# Patient Record
Sex: Male | Born: 1990 | Race: Black or African American | Hispanic: No | Marital: Single | Smoking: Never smoker
Health system: Southern US, Community
[De-identification: ages and names within clinical notes are randomized; demographics above are authoritative.]

## PROBLEM LIST (undated history)

## (undated) DIAGNOSIS — R509 Fever, unspecified: Secondary | ICD-10-CM

## (undated) DIAGNOSIS — G8929 Other chronic pain: Secondary | ICD-10-CM

## (undated) DIAGNOSIS — M545 Low back pain, unspecified: Secondary | ICD-10-CM

## (undated) HISTORY — PX: NO PAST SURGERIES: SHX2092

---

## 2017-01-30 ENCOUNTER — Inpatient Hospital Stay (HOSPITAL_COMMUNITY)
Admission: EM | Admit: 2017-01-30 | Discharge: 2017-02-04 | DRG: 864 | Disposition: A | Discharge status: Home / Self Care | Payer: Self-pay | Attending: Internal Medicine | Admitting: Internal Medicine

## 2017-01-30 ENCOUNTER — Emergency Department (HOSPITAL_COMMUNITY): Payer: Self-pay

## 2017-01-30 ENCOUNTER — Inpatient Hospital Stay (HOSPITAL_COMMUNITY): Payer: Self-pay

## 2017-01-30 ENCOUNTER — Encounter (HOSPITAL_COMMUNITY): Payer: Self-pay | Admitting: *Deleted

## 2017-01-30 DIAGNOSIS — D721 Eosinophilia: Secondary | ICD-10-CM | POA: Diagnosis present

## 2017-01-30 DIAGNOSIS — K760 Fatty (change of) liver, not elsewhere classified: Secondary | ICD-10-CM | POA: Diagnosis present

## 2017-01-30 DIAGNOSIS — E860 Dehydration: Secondary | ICD-10-CM | POA: Diagnosis present

## 2017-01-30 DIAGNOSIS — R911 Solitary pulmonary nodule: Secondary | ICD-10-CM | POA: Diagnosis present

## 2017-01-30 DIAGNOSIS — R072 Precordial pain: Secondary | ICD-10-CM | POA: Diagnosis present

## 2017-01-30 DIAGNOSIS — R509 Fever, unspecified: Principal | ICD-10-CM | POA: Diagnosis present

## 2017-01-30 DIAGNOSIS — D649 Anemia, unspecified: Secondary | ICD-10-CM | POA: Diagnosis present

## 2017-01-30 DIAGNOSIS — R7402 Elevation of levels of lactic acid dehydrogenase (LDH): Secondary | ICD-10-CM

## 2017-01-30 DIAGNOSIS — E871 Hypo-osmolality and hyponatremia: Secondary | ICD-10-CM | POA: Diagnosis present

## 2017-01-30 DIAGNOSIS — R21 Rash and other nonspecific skin eruption: Secondary | ICD-10-CM | POA: Diagnosis present

## 2017-01-30 DIAGNOSIS — M25561 Pain in right knee: Secondary | ICD-10-CM | POA: Diagnosis present

## 2017-01-30 DIAGNOSIS — R740 Nonspecific elevation of levels of transaminase and lactic acid dehydrogenase [LDH]: Secondary | ICD-10-CM | POA: Diagnosis present

## 2017-01-30 DIAGNOSIS — M20099 Other deformity of finger(s), unspecified finger(s): Secondary | ICD-10-CM | POA: Diagnosis present

## 2017-01-30 DIAGNOSIS — R0789 Other chest pain: Secondary | ICD-10-CM

## 2017-01-30 DIAGNOSIS — I361 Nonrheumatic tricuspid (valve) insufficiency: Secondary | ICD-10-CM

## 2017-01-30 DIAGNOSIS — M25562 Pain in left knee: Secondary | ICD-10-CM | POA: Diagnosis present

## 2017-01-30 DIAGNOSIS — R590 Localized enlarged lymph nodes: Secondary | ICD-10-CM | POA: Diagnosis present

## 2017-01-30 DIAGNOSIS — N179 Acute kidney failure, unspecified: Secondary | ICD-10-CM | POA: Diagnosis present

## 2017-01-30 HISTORY — DX: Fever, unspecified: R50.9

## 2017-01-30 HISTORY — DX: Low back pain, unspecified: M54.50

## 2017-01-30 HISTORY — DX: Low back pain: M54.5

## 2017-01-30 HISTORY — DX: Other chronic pain: G89.29

## 2017-01-30 LAB — CBC WITH DIFFERENTIAL/PLATELET
BASOS PCT: 1 %
Basophils Absolute: 0.1 10*3/uL (ref 0.0–0.1)
EOS ABS: 0 10*3/uL (ref 0.0–0.7)
EOS PCT: 0 %
HEMATOCRIT: 34.7 % — AB (ref 39.0–52.0)
Hemoglobin: 11.2 g/dL — ABNORMAL LOW (ref 13.0–17.0)
LYMPHS PCT: 11 %
Lymphs Abs: 1.6 10*3/uL (ref 0.7–4.0)
MCH: 27.6 pg (ref 26.0–34.0)
MCHC: 32.3 g/dL (ref 30.0–36.0)
MCV: 85.5 fL (ref 78.0–100.0)
Monocytes Absolute: 0.6 10*3/uL (ref 0.1–1.0)
Monocytes Relative: 4 %
NEUTROS PCT: 84 %
Neutro Abs: 12.3 10*3/uL — ABNORMAL HIGH (ref 1.7–7.7)
PLATELETS: 638 10*3/uL — AB (ref 150–400)
RBC: 4.06 MIL/uL — AB (ref 4.22–5.81)
RDW: 12.8 % (ref 11.5–15.5)
WBC: 14.6 10*3/uL — AB (ref 4.0–10.5)

## 2017-01-30 LAB — ECHOCARDIOGRAM COMPLETE
AVLVOTPG: 5 mmHg
CHL CUP DOP CALC LVOT VTI: 16.3 cm
CHL CUP MV DEC (S): 195
E/e' ratio: 11.29
EWDT: 195 ms
FS: 50 % — AB (ref 28–44)
Height: 63 in
IV/PV OW: 1.05
LA diam index: 1.41 cm/m2
LASIZE: 27 mm
LAVOL: 37.8 mL
LAVOLA4C: 37.7 mL
LAVOLIN: 19.7 mL/m2
LDCA: 2.54 cm2
LEFT ATRIUM END SYS DIAM: 27 mm
LV E/e' medial: 11.29
LV E/e'average: 11.29
LV PW d: 9.49 mm — AB (ref 0.6–1.1)
LV TDI E'MEDIAL: 7.4
LVELAT: 6.53 cm/s
LVOT peak vel: 115 cm/s
LVOTD: 18 mm
LVOTSV: 41 mL
MV Peak grad: 2 mmHg
MV pk E vel: 73.7 m/s
MVPKAVEL: 56.8 m/s
TAPSE: 24.8 mm
TDI e' lateral: 6.53
Weight: 2821.89 oz

## 2017-01-30 LAB — RAPID HIV SCREEN (HIV 1/2 AB+AG)
HIV 1/2 ANTIBODIES: NONREACTIVE
HIV-1 P24 ANTIGEN - HIV24: NONREACTIVE

## 2017-01-30 LAB — TROPONIN I
Troponin I: <0.03 ng/mL (ref <0.03)
Troponin I: <0.03 ng/mL (ref <0.03)

## 2017-01-30 LAB — URINALYSIS, ROUTINE W REFLEX MICROSCOPIC
Bilirubin Urine: NEGATIVE
GLUCOSE, UA: NEGATIVE mg/dL
Ketones, ur: 20 mg/dL — AB
LEUKOCYTES UA: NEGATIVE
NITRITE: NEGATIVE
PH: 5 (ref 5.0–8.0)
Protein, ur: 30 mg/dL — AB
Renal Epithelial: 4
SPECIFIC GRAVITY, URINE: 1.026 (ref 1.005–1.030)
Squamous Epithelial / LPF: NONE SEEN

## 2017-01-30 LAB — COMPREHENSIVE METABOLIC PANEL
ALT: 32 U/L (ref 17–63)
ANION GAP: 14 (ref 5–15)
AST: 95 U/L — ABNORMAL HIGH (ref 15–41)
Albumin: 2.9 g/dL — ABNORMAL LOW (ref 3.5–5.0)
Alkaline Phosphatase: 89 U/L (ref 38–126)
BUN: 20 mg/dL (ref 6–20)
CHLORIDE: 95 mmol/L — AB (ref 101–111)
CO2: 19 mmol/L — AB (ref 22–32)
Calcium: 9 mg/dL (ref 8.9–10.3)
Creatinine, Ser: 1.33 mg/dL — ABNORMAL HIGH (ref 0.61–1.24)
Glucose, Bld: 99 mg/dL (ref 65–99)
Potassium: 3.3 mmol/L — ABNORMAL LOW (ref 3.5–5.1)
SODIUM: 128 mmol/L — AB (ref 135–145)
Total Bilirubin: 1.4 mg/dL — ABNORMAL HIGH (ref 0.3–1.2)
Total Protein: 8 g/dL (ref 6.5–8.1)

## 2017-01-30 LAB — CK: CK TOTAL: 254 U/L (ref 49–397)

## 2017-01-30 LAB — OSMOLALITY, URINE: OSMOLALITY UR: 824 mosm/kg (ref 300–900)

## 2017-01-30 LAB — I-STAT CG4 LACTIC ACID, ED: LACTIC ACID, VENOUS: 1.87 mmol/L (ref 0.5–1.9)

## 2017-01-30 LAB — SEDIMENTATION RATE: Sed Rate: 123 mm/hr — ABNORMAL HIGH (ref 0–16)

## 2017-01-30 LAB — SAVE SMEAR

## 2017-01-30 LAB — C-REACTIVE PROTEIN: CRP: 19.4 mg/dL — ABNORMAL HIGH (ref <1.0)

## 2017-01-30 LAB — CREATININE, URINE, RANDOM: Creatinine, Urine: 265.84 mg/dL

## 2017-01-30 LAB — SODIUM, URINE, RANDOM: SODIUM UR: 36 mmol/L

## 2017-01-30 MED ORDER — LACTATED RINGERS IV BOLUS (SEPSIS)
1000.0000 mL | Freq: Once | INTRAVENOUS | Status: AC
  Administered 2017-01-30: 1000 mL via INTRAVENOUS

## 2017-01-30 MED ORDER — ACETAMINOPHEN 650 MG RE SUPP: 650.0000 mg | Freq: Four times a day (QID) | RECTAL | Status: DC | PRN

## 2017-01-30 MED ORDER — ACETAMINOPHEN 325 MG PO TABS
650.0000 mg | ORAL_TABLET | Freq: Four times a day (QID) | ORAL | Status: DC | PRN
  Administered 2017-01-30 – 2017-02-01 (×6): 650 mg via ORAL
  Filled 2017-01-30 (×6): qty 2

## 2017-01-30 MED ORDER — ACETAMINOPHEN 500 MG PO TABS
1000.0000 mg | ORAL_TABLET | Freq: Once | ORAL | Status: AC
  Administered 2017-01-30: 1000 mg via ORAL
  Filled 2017-01-30: qty 2

## 2017-01-30 MED ORDER — ONDANSETRON HCL 4 MG/2ML IJ SOLN: 4.0000 mg | Freq: Four times a day (QID) | INTRAMUSCULAR | Status: DC | PRN

## 2017-01-30 MED ORDER — ONDANSETRON HCL 4 MG PO TABS: 4.0000 mg | ORAL_TABLET | Freq: Four times a day (QID) | ORAL | Status: DC | PRN

## 2017-01-30 MED ORDER — ENSURE ENLIVE PO LIQD
237.0000 mL | Freq: Two times a day (BID) | ORAL | Status: DC
  Administered 2017-01-31 – 2017-02-01 (×3): 237 mL via ORAL

## 2017-01-30 MED ORDER — SODIUM CHLORIDE 0.9 % IV SOLN
INTRAVENOUS | Status: DC
  Administered 2017-01-30 – 2017-02-03 (×11): via INTRAVENOUS

## 2017-01-30 MED ORDER — HYDROCORTISONE 1 % EX CREA
TOPICAL_CREAM | Freq: Two times a day (BID) | CUTANEOUS | Status: DC
  Administered 2017-01-30 – 2017-02-04 (×9): via TOPICAL
  Filled 2017-01-30 (×3): qty 28

## 2017-01-30 MED ORDER — ENOXAPARIN SODIUM 40 MG/0.4ML ~~LOC~~ SOLN
40.0000 mg | SUBCUTANEOUS | Status: AC
  Administered 2017-01-30 – 2017-02-02 (×4): 40 mg via SUBCUTANEOUS
  Filled 2017-01-30 (×4): qty 0.4

## 2017-01-30 NOTE — H&P (Signed)
History and Physical    Carlos Holder KNL:[id number] DOB: 11/23/1990 DOA: 01/30/2017  PCP: Patient, No Pcp Per Patient coming from: home  Chief Complaint: generalized body aches, hand swelling, fevers.   HPI: Carlos Holder is a 26 y.o. male with no significant past medical history. Patient is an immigrant to the [place] from the [place] 2 years ago. Patient states he is not up-to-date on any of his immunizations. Patient was in a home with 3 other individuals from [place]. All others in the home are healthy but is unsure of their immunization status. No individuals in the home have traveled outside the country in the last several months.  Patient was in his normal state of health until approximately 3 weeks prior to admission when he developed intermittent knee pain. Seemed to have a waxing and waning nature but was worse with ambulation. This would occur approximately 3-4 days out of the week. Patient unable to identify any trigger or alleviating factor associated with the pain other than rest. (Level V caveat applies as patient encounter was dictated by [place] interpreter with some difficulty. For example through the interpreter pt stated he had foot pain but was clearly pointing to his knee.). Patient states he has also felt warm over this period of time and has also felt itchy. Patient does endorse chest pain which seems to be described as a deep feeling of pain in his chest. This pain is nonexertional and is not associated with shortness of breath, diaphoresis, nausea or radiation to the neck arm or back. This pain is constant and mild. Rash on upper chest started approximately 1 week ago gradually across upper chest, arms and upper back. Patient denies ever being sexually active. Denies nausea, vomiting, abdominal pain, dysuria, frequency, penile discharge, groin adenopathy, neck stiffness, headache, LOC, focal neurological deficit. Patient does endorse approximately 2 week history  of bilateral middle finger swelling and pain. Constant.  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 Given a 1 L lactated Ringer's bolus and 1000 mg of Tylenol for fever. ID consult.  Review of Systems: As per HPI otherwise all other systems reviewed and are negative  Ambulatory Status: no restrictions  History reviewed. No pertinent past medical history.  Past Surgical History:  Procedure Laterality Date  . NO PAST SURGERIES      Social History   Social History  . Marital status: Single    Spouse name: N/A  . Number of children: N/A  . Years of education: N/A   Occupational History  . Not on file.   Social History Main Topics  . Smoking status: Never Smoker  . Smokeless tobacco: Never Used  . Alcohol use Yes     Comment: occ  . Drug use: No  . Sexual activity: No   Other Topics Concern  . Not on file   Social History Narrative  . No narrative on file    No Known Allergies  Family History  Problem Relation Age of Onset  . Stroke Neg Hx   . Heart disease Neg Hx   . Diabetes Neg Hx       Prior to Admission medications   Not on File    Physical Exam: Vitals:   01/30/17 1400 01/30/17 1422 01/30/17 1500 01/30/17 1600  BP: 118/80  121/80 114/80  Pulse: 90  92 99  Resp: (!) 21  (!) 21 (!) 22  Temp:  99.7 F (37.6 C)  TempSrc:  Oral    SpO2: 96%  99% 96%  Weight:      Height:         General:  Appears calm and comfortable Eyes:  PERRL, EOMI, normal lids, iris ENT:  grossly normal hearing, lips & tongue, mmm Neck:  no LAD, masses or thyromegaly Cardiovascular:  RRR, no m/r/g. No LE edema.  Respiratory:  CTA bilaterally, no w/r/r. Normal respiratory effort. Abdomen:  soft, ntnd, NABS Skin: Areas of excoriated skin along the upper chest bilateral upper extremities proximally and upper back. No appreciable papules, vesicles or other rash. Areas of flaky dry skin appreciated. Musculoskeletal:   grossly normal tone BUE/BLE, good ROM, no bony abnormality, knees tender to palpation bilaterally though no edema appreciated, bilateral middle fingers swollen Psychiatric:  grossly normal mood and affect, speech fluent and appropriate, AOx3 Neurologic:  CN 2-12 grossly intact, moves all extremities in coordinated fashion, sensation intact  Labs on Admission: I have personally reviewed following labs and imaging studies  CBC:  Recent Labs Lab 01/30/17 0925  WBC 14.6*  NEUTROABS 12.3*  HGB 11.2*  HCT 34.7*  MCV 85.5  PLT 814*   Basic Metabolic Panel:  Recent Labs Lab 01/30/17 0925  NA 128*  K 3.3*  CL 95*  CO2 19*  GLUCOSE 99  BUN 20  CREATININE 1.33*  CALCIUM 9.0   GFR: Estimated Creatinine Clearance: 78 mL/min (A) (by C-G formula based on SCr of 1.33 mg/dL (H)). Liver Function Tests:  Recent Labs Lab 01/30/17 0925  AST 95*  ALT 32  ALKPHOS 89  BILITOT 1.4*  PROT 8.0  ALBUMIN 2.9*   No results for input(s): LIPASE, AMYLASE in the last 168 hours. No results for input(s): AMMONIA in the last 168 hours. Coagulation Profile: No results for input(s): INR, PROTIME in the last 168 hours. Cardiac Enzymes:  Recent Labs Lab 01/30/17 0925 01/30/17 1135  CKTOTAL 254  --   TROPONINI  --  <0.03   BNP (last 3 results) No results for input(s): PROBNP in the last 8760 hours. HbA1C: No results for input(s): HGBA1C in the last 72 hours. CBG: No results for input(s): GLUCAP in the last 168 hours. Lipid Profile: No results for input(s): CHOL, HDL, LDLCALC, TRIG, CHOLHDL, LDLDIRECT in the last 72 hours. Thyroid Function Tests: No results for input(s): TSH, T4TOTAL, FREET4, T3FREE, THYROIDAB in the last 72 hours. Anemia Panel: No results for input(s): VITAMINB12, FOLATE, FERRITIN, TIBC, IRON, RETICCTPCT in the last 72 hours. Urine analysis:    Component Value Date/Time   COLORURINE YELLOW 01/30/2017 1248   APPEARANCEUR HAZY (A) 01/30/2017 1248   LABSPEC 1.026  01/30/2017 1248   PHURINE 5.0 01/30/2017 1248   GLUCOSEU NEGATIVE 01/30/2017 1248   HGBUR MODERATE (A) 01/30/2017 1248   BILIRUBINUR NEGATIVE 01/30/2017 1248   KETONESUR 20 (A) 01/30/2017 1248   PROTEINUR 30 (A) 01/30/2017 1248   NITRITE NEGATIVE 01/30/2017 1248   LEUKOCYTESUR NEGATIVE 01/30/2017 1248    Creatinine Clearance: Estimated Creatinine Clearance: 78 mL/min (A) (by C-G formula based on SCr of 1.33 mg/dL (H)).  Sepsis Labs: @LABRCNTIP (procalcitonin:4,lacticidven:4) )No results found for this or any previous visit (from the past 240 hour(s)).   Radiological Exams on Admission: Dg Chest 2 View  Result Date: 01/30/2017 CLINICAL DATA:  Body aches, rash and fevers, 3 weeks duration. EXAM: CHEST  2 VIEW COMPARISON:  None. FINDINGS: Heart size is normal. Mediastinal shadows are normal. Mild left hilar prominence likely due to pulmonary vessels. The lungs are clear. No  effusions. No bone abnormality. IMPRESSION: Normal chest Electronically Signed   By: Nelson Chimes M.D.   On: 01/30/2017 10:01    EKG: Independently reviewed. Sinus. No ACS. Non-specific T-wave changes  Assessment/Plan Active Problems:   FUO (fever of unknown origin)   Atypical chest pain   Rash   AKI (acute kidney injury) ([place])   Hyponatremia    FUO: constellation of sx w/o clearly identifiable etiology. Rash on arms and chest/back, B knee pain w/o swelling, B middle finger swelling and pain, and fever for 3 weeks, WBC 14.6. Unknown if pt has had any vaccinations as he denies being up to date. Imigrant from [place] 2 yrs ago. Denies ever being sexually active. Suspect viral etiology but will need assistance from ID to further diagnose/work up. CXR w/o acute process. - ID consult - BCX, UCX  - UA - ESR, CRP, HIV, RPR, GC/Chl, CK, CMV, EBV, Hepatitis panel - Peripheral smear - Viral panel - TYlenol/motrin prn fever.   Chest pain: I suspect that difficulty with interpretation makes patient's description of chest  pain somewhat difficult to fully determine etiology. Initially there is concern for possible endocarditis given EKG changes, description of deep chest pain that was constant. However I am also considering that this is likely simply due to the rash on his chest wall. - Telemetry  - Echo STAT - cycle trop - treatment of rash  Rash: doubt scabies or bed bugs. Likely due to cause of FUO.  - Will provide hydrocortisone 1% cream for itch relief - consider treatment as fungal or bacterial based rash if not improving.   AKI: Cr 1.33. Likely from dehydration from very poor oral intake for several days. - IVF - BMP in am  Hyponatremia: 128. Likely from poor oral intake for several days w/ limited free water intake.  - Osmolality studies  - IVF - BMP in am   DVT prophylaxis: Lovenox   Code Status: full  Family Communication: none  Disposition Plan: pending improvement and workup fur FUO  Consults called: ID  Admission status: inpt    Carlos Holder J MD [facility]  If 7PM-7AM, please contact night-coverage [email or web address] Password [email or web address]  01/30/2017, 5:09 PM

## 2017-01-30 NOTE — Consult Note (Signed)
[facility]    Date of Admission:  01/30/2017    Total days of antibiotics 0              Reason for Consult: Fever of unknown etiology    Referring Provider: Shelly Flatten MD Primary Care Provider: No PCP listed  Assessment: Fever of unknown etiology. He is having very nonspecific symptoms with elevated inflammatory markers and some transaminitis, most lab work is underway including cultures and testing for CMV, EBV, hepatitis panel. He appears dehydrated and might get benefit with IV fluid resuscitation.  Plan: 1. We will wait for further testing results and manage fever symptomatically.  Active Problems:   FUO (fever of unknown origin)     HPI: Carlos Holder is a 26 y.o. male with no significant PMHx presented with c/o intermittent fever for 3 weeks,he never checked his temperature at home,his father was giving him some unknown medicine for fever.  His ROS was negative except mild non specific substernal chest pain, and upper back pain and some unintentional weight loss but he never weigh himself.  IN ED he was found to have temp. Of 103.4 and ID was consulted for fever of unknown etiology.  Review of Systems: ROS  A complete ROS was negative except as per HPI.  General: Positive for fever, chills, fatigue, unexpected weight loss, change in appetite and diaphoresis.  Respiratory: Denies SOB, cough, DOE, chest tightness, and wheezing.    Cardiovascular: Substernal chest pain,  And no palpitations.  Gastrointestinal: Denies nausea, vomiting, abdominal pain, diarrhea, constipation, blood in stool and abdominal distention.  Genitourinary: Denies dysuria, urgency, frequency, hematuria, suprapubic pain and flank pain. Endocrine: Denies hot or cold intolerance, polyuria, and polydipsia. Musculoskeletal:upper back pain, Denies myalgias,  joint swelling, arthralgias and gait problem.  Skin: Denies pallor, rash and wounds.  Neurological: Denies  dizziness, headaches, weakness, lightheadedness, numbness, seizures, and syncope. Psychiatric/Behavioral: Denies mood changes, confusion, nervousness, sleep disturbance and agitation.  History reviewed. No pertinent past medical history.  Social History  Substance Use Topics  . Smoking status: Never Smoker  . Smokeless tobacco: Never Used  . Alcohol use Yes     Comment: occ    No family history on file. No Known Allergies  OBJECTIVE: Blood pressure 118/80, pulse 90, temperature 99.7 F (37.6 C), temperature source Oral, resp. rate (!) 21, height 5\' 3"  (1.6 m), weight 176 lb 5.9 oz (80 kg), SpO2 96 %.  Physical Exam  Vitals:   01/30/17 1200 01/30/17 1300 01/30/17 1400 01/30/17 1422  BP: 114/74 120/84 118/80   Pulse: 93 88 90   Resp: 16 19 (!) 21   Temp:    99.7 F (37.6 C)  TempSrc:    Oral  SpO2: 98% 97% 96%   Weight:      Height:       General: Vital signs reviewed.  Patient is well-developed and well-nourished, in no acute distress and cooperative with exam.  Head: Normocephalic and atraumatic. Eyes: EOMI, conjunctivae normal, no scleral icterus.  Neck: Supple, trachea midline, normal ROM, no JVD, masses, thyromegaly,lymphadenopathy  present.  Cardiovascular: RRR, S1 normal, S2 normal, no murmurs, gallops, or rubs. Pulmonary/Chest: Clear to auscultation bilaterally, no wheezes, rales, or rhonchi. Abdominal: Soft, non-tender, non-distended, BS +, no masses, organomegaly, or guarding present.  Musculoskeletal: No joint deformities, erythema, or stiffness, ROM full and nontender. Extremities: No lower extremity edema bilaterally,  pulses symmetric and intact bilaterally. No cyanosis or clubbing.  Neurological: A&O x3, Strength is normal and symmetric bilaterally, cranial nerve II-XII are grossly intact, no focal motor deficit, sensory intact to light touch bilaterally.  Skin: Warm, dry and intact.Small excoriation mark on upper thoracic area Psychiatric: Normal mood and  affect. speech and behavior is normal. Cognition and memory are normal.  Lab Results Lab Results  Component Value Date   WBC 14.6 (H) 01/30/2017   HGB 11.2 (L) 01/30/2017   HCT 34.7 (L) 01/30/2017   MCV 85.5 01/30/2017   PLT 638 (H) 01/30/2017    Lab Results  Component Value Date   CREATININE 1.33 (H) 01/30/2017   BUN 20 01/30/2017   NA 128 (L) 01/30/2017   K 3.3 (L) 01/30/2017   CL 95 (L) 01/30/2017   CO2 19 (L) 01/30/2017    Lab Results  Component Value Date   ALT 32 01/30/2017   AST 95 (H) 01/30/2017   ALKPHOS 89 01/30/2017   BILITOT 1.4 (H) 01/30/2017     Microbiology: No results found for this or any previous visit (from the past 240 hour(s)).  Arnetha CourserSumayya Alen Matheson, MD [facility] [phone] pager   [phone] cell 01/30/2017, 3:03 PM

## 2017-01-30 NOTE — ED Notes (Signed)
Patient transported to X-ray 

## 2017-01-30 NOTE — ED Provider Notes (Signed)
[facility]EM[facility]ENCY DEPT Provider Note   CSN: [id number] Arrival date & time: 01/30/17  [id number]     History   Chief Complaint Chief Complaint  Patient presents with  . Generalized Body Aches    HPI Carlos Holder is a 26 y.o. male.  This is an otherwise healthy 26 year old immigrant from [place] who emigrated 2 years ago but now presents immersed department with approximately 3-4 weeks of multiple symptoms. It sounds like his symptoms started with bilateral foot pain that persisted for a few days without any symptoms of swelling or rash and then he developed a fever that was subjective associated with a rash on his torso. These are gone on for a few weeks. The rash is pruritic and covers his anterior chest and his upper back and is just shedding skin basically. Was never petechial or purpuric. During last few weeks she has also had swelling and pain of bilateral third fingers but no other joint issues. No blood bite, recent travels, sick contacts, weird foods or history of the same. Has not tried any of his symptoms but knows of nothing that makes them better or worse. His father states the patient has been more weak and ataxic is lost some weight over the last few weeks. No family history of autoimmune diseases or cancer that they know of.   The history is provided by the patient and a relative. A language interpreter was used.    History reviewed. No pertinent past medical history.  Patient Active Problem List   Diagnosis Date Noted  . FUO (fever of unknown origin) 01/30/2017    Past Surgical History:  Procedure Laterality Date  . NO PAST SURGERIES       patient has no significant past medical history presents to the emergency department today for fever, rash and multiple other complaints.   Home Medications    Prior to Admission medications   Medication Sig Start Date End Date Taking? Authorizing Provider  ibuprofen (ADVIL,MOTRIN) 200 MG tablet Take 200 mg by mouth  every 6 (six) hours as needed for moderate pain.   Yes [provider]    Family History Family History  Problem Relation Age of Onset  . Stroke Neg Hx   . Heart disease Neg Hx   . Diabetes Neg Hx     Social History Social History  Substance Use Topics  . Smoking status: Never Smoker  . Smokeless tobacco: Never Used  . Alcohol use Yes     Comment: occ     Allergies   Patient has no known allergies.   Review of Systems Review of Systems  All other systems reviewed and are negative.    Physical Exam Updated Vital Signs BP 114/80   Pulse 99   Temp 99.7 F (37.6 C) (Oral)   Resp (!) 22   Ht 5\' 3"  (1.6 m)   Wt 80 kg (176 lb 5.9 oz)   SpO2 96%   BMI 31.24 kg/m   Physical Exam  Constitutional: He is oriented to person, place, and time. He appears well-developed and well-nourished.  HENT:  Head: Normocephalic and atraumatic.  No oral lesions  Eyes: Conjunctivae and EOM are normal. Scleral icterus (mild) is present.  Neck: Normal range of motion.  Cardiovascular: Normal rate.  Exam reveals no gallop.   No murmur heard. Pulmonary/Chest: Effort normal. No respiratory distress. He has no wheezes. He has no rales.  Abdominal: Soft. He exhibits no distension.  Musculoskeletal: Normal range of motion.  Bilateral diffuse swelling of middle fingers with out erythema but does have pain with palpation and ROM  Neurological: He is alert and oriented to person, place, and time.  No altered mental status, able to give full seemingly accurate history.  Face is symmetric, EOM's intact, pupils equal and reactive, vision intact, tongue and uvula midline without deviation. Upper and Lower extremity motor 5/5, intact pain perception in distal extremities, 2+ reflexes in biceps, patella and achilles tendons.   Skin: Skin is warm and dry. Rash (dried peeling skin to upper chest and back none on arms/hands/legs or face) noted.  Nursing note and vitals reviewed.    ED  Treatments / Results  Labs (all labs ordered are listed, but only abnormal results are displayed) Labs Reviewed  COMPREHENSIVE METABOLIC PANEL - Abnormal; Notable for the following:       Result Value   Sodium 128 (*)    Potassium 3.3 (*)    Chloride 95 (*)    CO2 19 (*)    Creatinine, Ser 1.33 (*)    Albumin 2.9 (*)    AST 95 (*)    Total Bilirubin 1.4 (*)    All other components within normal limits  CBC WITH DIFFERENTIAL/PLATELET - Abnormal; Notable for the following:    WBC 14.6 (*)    RBC 4.06 (*)    Hemoglobin 11.2 (*)    HCT 34.7 (*)    Platelets 638 (*)    Neutro Abs 12.3 (*)    All other components within normal limits  URINALYSIS, ROUTINE W REFLEX MICROSCOPIC - Abnormal; Notable for the following:    APPearance HAZY (*)    Hgb urine dipstick MODERATE (*)    Ketones, ur 20 (*)    Protein, ur 30 (*)    Bacteria, UA FEW (*)    All other components within normal limits  SEDIMENTATION RATE - Abnormal; Notable for the following:    Sed Rate 123 (*)    All other components within normal limits  C-REACTIVE PROTEIN - Abnormal; Notable for the following:    CRP 19.4 (*)    All other components within normal limits  CULTURE, BLOOD (ROUTINE X 2)  CULTURE, BLOOD (ROUTINE X 2)  CK  RAPID HIV SCREEN (HIV 1/2 AB+AG)  TROPONIN I  OSMOLALITY, URINE  SODIUM, URINE, RANDOM  CREATININE, URINE, RANDOM  RHEUMATOID FACTOR  RPR  HIV ANTIBODY (ROUTINE TESTING)  HEPATITIS PANEL, ACUTE  EPSTEIN-BARR VIRUS VCA, IGM  CMV IGM  CMV ANTIBODY, IGG (EIA)  EPSTEIN-BARR VIRUS VCA, IGG  RSV(RESPIRATORY SYNCYTIAL VIRUS) AB, BLOOD  I-STAT CG4 LACTIC ACID, ED  I-STAT CG4 LACTIC ACID, ED  GC/CHLAMYDIA PROBE AMP ([place]) NOT AT [facility]    EKG  EKG Interpretation  Date/Time:  Wednesday January 30 2017 11:20:46 EDT Ventricular Rate:  101 PR Interval:    QRS Duration: 72 QT Interval:  354 QTC Calculation: 459 R Axis:   50 Text Interpretation:  Sinus tachycardia Borderline T  abnormalities, inferior leads No old tracing to compare Confirmed by Marily Memos [phone]) on 01/30/2017 11:26:07 AM       Radiology Dg Chest 2 View  Result Date: 01/30/2017 CLINICAL DATA:  Body aches, rash and fevers, 3 weeks duration. EXAM: CHEST  2 VIEW COMPARISON:  None. FINDINGS: Heart size is normal. Mediastinal shadows are normal. Mild left hilar prominence likely due to pulmonary vessels. The lungs are clear. No effusions. No bone abnormality. IMPRESSION: Normal chest Electronically Signed   By: Scherrie Bateman.D.  On: 01/30/2017 10:01    Procedures Procedures (including critical care time)  Medications Ordered in ED Medications  acetaminophen (TYLENOL) tablet 1,000 mg (1,000 mg Oral Given 01/30/17 1011)  lactated ringers bolus 1,000 mL (0 mLs Intravenous Stopped 01/30/17 1206)     Initial Impression / Assessment and Plan / ED Course  I have reviewed the triage vital signs and the nursing notes.  Pertinent labs & imaging results that were available during my care of the patient were reviewed by me and considered in my medical decision making (see chart for details).  Large workup done, discussed with ID who thinks viral vs autoimmune. Did have some t wave changes on ECG, possibley endocarditis? No vaccinations so many other things on ddx as well. Plan for fluids, antipyretics, hospital admission.   Final Clinical Impressions(s) / ED Diagnoses   Final diagnoses:  FUO (fever of unknown origin)      Mirriam Vadala, Barbara Cower, MD 01/30/17 1650

## 2017-01-30 NOTE — Progress Notes (Signed)
Pt temp-102.4. MD notified.

## 2017-01-30 NOTE — Progress Notes (Signed)
New Admission Note:   Arrival Method: Stretcher Mental Orientation: A&O X4 Telemetry: Initiated Assessment: Completed Skin: See flowsheets IV: Clean, Dry, Intact Pain: Denies Safety Measures: Safety Fall Prevention Plan has been given, discussed and signed Admission: Completed Unit Orientation: Patient has been orientated to the room, unit and staff.  Family: Father at bedside  Orders have been reviewed and implemented. Will continue to monitor the patient. Call light has been placed within reach and bed alarm has been activated.    Britt BologneseAnisha Mabe RN, BSN

## 2017-01-30 NOTE — Progress Notes (Signed)
MD notified about droplet precautions. MD stated that patient does not need to be on droplet precautions.

## 2017-01-30 NOTE — ED Triage Notes (Signed)
PT states body aches, rash, fevers, fatigue and itching x 3 weeks.  Joints of both middle digits on hands are swollen.  Has lived in [place] 2 years.

## 2017-01-30 NOTE — ED Notes (Signed)
Attempted report 

## 2017-01-30 NOTE — Progress Notes (Signed)
  Echocardiogram 2D Echocardiogram has been performed.  Carlos SavoyCasey N Via Holder 01/30/2017, 1:45 PM

## 2017-01-31 ENCOUNTER — Inpatient Hospital Stay (HOSPITAL_COMMUNITY): Payer: Self-pay

## 2017-01-31 DIAGNOSIS — R0789 Other chest pain: Secondary | ICD-10-CM

## 2017-01-31 DIAGNOSIS — R740 Nonspecific elevation of levels of transaminase and lactic acid dehydrogenase [LDH]: Secondary | ICD-10-CM

## 2017-01-31 DIAGNOSIS — E871 Hypo-osmolality and hyponatremia: Secondary | ICD-10-CM

## 2017-01-31 DIAGNOSIS — N179 Acute kidney failure, unspecified: Secondary | ICD-10-CM

## 2017-01-31 LAB — RESPIRATORY PANEL BY PCR

## 2017-01-31 LAB — CBC
HCT: 29.6 % — ABNORMAL LOW (ref 39.0–52.0)
Hemoglobin: 9.5 g/dL — ABNORMAL LOW (ref 13.0–17.0)
MCH: 28.1 pg (ref 26.0–34.0)
MCHC: 32.1 g/dL (ref 30.0–36.0)
MCV: 87.6 fL (ref 78.0–100.0)
PLATELETS: 611 10*3/uL — AB (ref 150–400)
RBC: 3.38 MIL/uL — ABNORMAL LOW (ref 4.22–5.81)
RDW: 12.9 % (ref 11.5–15.5)
WBC: 11.9 10*3/uL — ABNORMAL HIGH (ref 4.0–10.5)

## 2017-01-31 LAB — COMPREHENSIVE METABOLIC PANEL
ALT: 28 U/L (ref 17–63)
ANION GAP: 9 (ref 5–15)
AST: 86 U/L — ABNORMAL HIGH (ref 15–41)
Albumin: 2.3 g/dL — ABNORMAL LOW (ref 3.5–5.0)
Alkaline Phosphatase: 72 U/L (ref 38–126)
BUN: 16 mg/dL (ref 6–20)
CHLORIDE: 101 mmol/L (ref 101–111)
CO2: 24 mmol/L (ref 22–32)
Calcium: 8.5 mg/dL — ABNORMAL LOW (ref 8.9–10.3)
Creatinine, Ser: 1.18 mg/dL (ref 0.61–1.24)
GFR calc non Af Amer: >60 mL/min (ref >60)
Glucose, Bld: 93 mg/dL (ref 65–99)
POTASSIUM: 3.9 mmol/L (ref 3.5–5.1)
SODIUM: 134 mmol/L — AB (ref 135–145)
Total Bilirubin: 0.8 mg/dL (ref 0.3–1.2)
Total Protein: 6.9 g/dL (ref 6.5–8.1)

## 2017-01-31 LAB — TROPONIN I: Troponin I: <0.03 ng/mL (ref <0.03)

## 2017-01-31 LAB — GC/CHLAMYDIA PROBE AMP (~~LOC~~) NOT AT ARMC
Chlamydia: NEGATIVE
Neisseria Gonorrhea: NEGATIVE

## 2017-01-31 LAB — EPSTEIN-BARR VIRUS VCA, IGG: EBV VCA IgG: >600 U/mL — ABNORMAL HIGH (ref 0.0–17.9)

## 2017-01-31 LAB — CMV ANTIBODY, IGG (EIA): CMV Ab - IgG: 2.2 U/mL — ABNORMAL HIGH (ref 0.00–0.59)

## 2017-01-31 LAB — HIV ANTIBODY (ROUTINE TESTING W REFLEX): HIV Screen 4th Generation wRfx: NONREACTIVE

## 2017-01-31 LAB — HEPATITIS PANEL, ACUTE
HCV Ab: <0.1 s/co ratio (ref 0.0–0.9)
Hep A IgM: NEGATIVE
Hep B C IgM: NEGATIVE
Hepatitis B Surface Ag: NEGATIVE

## 2017-01-31 LAB — LACTATE DEHYDROGENASE: LDH: 1165 U/L — ABNORMAL HIGH (ref 98–192)

## 2017-01-31 LAB — EPSTEIN-BARR VIRUS VCA, IGM: EBV VCA IgM: <36 U/mL (ref 0.0–35.9)

## 2017-01-31 LAB — RPR: RPR Ser Ql: NONREACTIVE

## 2017-01-31 LAB — RHEUMATOID FACTOR: RHEUMATOID FACTOR: 10.7 [IU]/mL (ref 0.0–13.9)

## 2017-01-31 LAB — PARASITE EXAM SCREEN, BLOOD-W CONF TO LABCORP (NOT @ ARMC)

## 2017-01-31 LAB — CMV IGM: CMV IgM: <30 AU/mL (ref 0.0–29.9)

## 2017-01-31 MED ORDER — IOPAMIDOL (ISOVUE-300) INJECTION 61%
INTRAVENOUS | Status: AC
  Administered 2017-01-31: 100 mL
  Filled 2017-01-31: qty 100

## 2017-01-31 NOTE — Progress Notes (Signed)
PROGRESS NOTE    Carlos Holder  ZOX:[id number] DOB: 1991/03/13 DOA: 01/30/2017 PCP: Patient, No Pcp Per    Brief Narrative:  26 y.o. male with no significant past medical history. Patient is an immigrant to the [place] from [place] 2 years ago. Patient states he is not up-to-date on any of his immunizations. Patient was in a home with 3 other individuals from [place]. All others in the home are healthy but is unsure of their immunization status. No individuals in the home have traveled outside the country in the last several months.  Patient was in his normal state of health until approximately 3 weeks prior to admission when he developed intermittent knee pain. Seemed to have a waxing and waning nature but was worse with ambulation. This would occur approximately 3-4 days out of the week. Patient unable to identify any trigger or alleviating factor associated with the pain other than rest. (Level V caveat applies as patient encounter was dictated by [place] interpreter with some difficulty. For example through the interpreter pt stated he had foot pain but was clearly pointing to his knee.). Patient states he has also felt warm over this period of time and has also felt itchy. Patient does endorse chest pain which seems to be described as a deep feeling of pain in his chest. This pain is nonexertional and is not associated with shortness of breath, diaphoresis, nausea or radiation to the neck arm or back. This pain is constant and mild. Rash on upper chest started approximately 1 week ago gradually across upper chest, arms and upper back. Patient denies ever being sexually active. Denies nausea, vomiting, abdominal pain, dysuria, frequency, penile discharge, groin adenopathy, neck stiffness, headache, LOC, focal neurological deficit. Patient does endorse approximately 2 week history of bilateral middle finger swelling and pain. Constant.  Assessment & Plan:   Active Problems:  FUO (fever of unknown origin)   Atypical chest pain   Rash   AKI (acute kidney injury) (HCC)   Hyponatremia  FUO:  - Patient with presenting rash on arms and chest/back, B knee pain w/o swelling, B middle finger swelling and pain, and fever for 3 weeks, presenting WBC 14.6. -ID following. Discussed case with Infectious Disease. Recommendation for CT chest, abd/pelvis with contrast to r/o underlying pathology -Labs reviewed. WBC improved. Continues to be febrile -Thus far, blood cx neg, hepatitis, HIV neg. Per ID, malaria screen preliminary neg. -Continue tylenol as needed.   Chest pain:  -Continued on tele -Stable at this time -Continue analgesics as tolerated  Rash:  - doubt scabies or bed bugs. Likely due to cause of FUO.  - Continue with hydrocortisone 1% cream for itch relief - May consider treatment as fungal or bacterial based rash if not improving.   AKI: Cr 1.33. Likely from dehydration from very poor oral intake for several days. - Renal function improved with IVF - Repeat bmet in AM  Hyponatremia: 128. Likely from poor oral intake for several days w/ limited free water intake.  - Improved with IVF hydration - repeat bmet in AM  DVT prophylaxis:   Code Status: Lovenox subQ Family Communication: Pt in room, family not at bedside Disposition Plan: Uncertain at this time  Consultants:   ID  Procedures:     Antimicrobials: Anti-infectives    None       Subjective: Complaining of continued chills  Objective: Vitals:   01/30/17 2313 01/31/17 0111 01/31/17 0636 01/31/17 0931  BP: 113/61  133/73  112/60  Pulse: (!) 113  98 98  Resp: 18  18 18   Temp: (!) 102.4 F (39.1 C) 98.3 F (36.8 C) (!) 102.6 F (39.2 C) 99.9 F (37.7 C)  TempSrc: Oral Oral Oral Oral  SpO2: 98%  97% 99%  Weight: 77.1 kg (170 lb)     Height:        Intake/Output Summary (Last 24 hours) at 01/31/17 1422 Last data filed at 01/31/17 1128  Gross per 24 hour  Intake           1914.17 ml  Output                0 ml  Net          1914.17 ml   Filed Weights   01/30/17 0848 01/30/17 2313  Weight: 80 kg (176 lb 5.9 oz) 77.1 kg (170 lb)    Examination:  General exam: Appears calm and comfortable  Respiratory system: Clear to auscultation. Respiratory effort normal. Cardiovascular system: S1 & S2 heard, RRR Gastrointestinal system: Abdomen is nondistended, soft and nontender. No organomegaly or masses felt. Normal bowel sounds heard. Central nervous system: Alert and oriented. No focal neurological deficits. Extremities: Symmetric 5 x 5 power. Skin: No rashes, lesions  Psychiatry: Judgement and insight appear normal. Mood & affect appropriate.   Data Reviewed: I have personally reviewed following labs and imaging studies  CBC:  Recent Labs Lab 01/30/17 0925 01/31/17 0534  WBC 14.6* 11.9*  NEUTROABS 12.3*  --   HGB 11.2* 9.5*  HCT 34.7* 29.6*  MCV 85.5 87.6  PLT 638* 611*   Basic Metabolic Panel:  Recent Labs Lab 01/30/17 0925 01/31/17 0534  NA 128* 134*  K 3.3* 3.9  CL 95* 101  CO2 19* 24  GLUCOSE 99 93  BUN 20 16  CREATININE 1.33* 1.18  CALCIUM 9.0 8.5*   GFR: Estimated Creatinine Clearance: 86.5 mL/min (by C-G formula based on SCr of 1.18 mg/dL). Liver Function Tests:  Recent Labs Lab 01/30/17 0925 01/31/17 0534  AST 95* 86*  ALT 32 28  ALKPHOS 89 72  BILITOT 1.4* 0.8  PROT 8.0 6.9  ALBUMIN 2.9* 2.3*   No results for input(s): LIPASE, AMYLASE in the last 168 hours. No results for input(s): AMMONIA in the last 168 hours. Coagulation Profile: No results for input(s): INR, PROTIME in the last 168 hours. Cardiac Enzymes:  Recent Labs Lab 01/30/17 0925 01/30/17 1135 01/30/17 1907 01/30/17 2313  CKTOTAL 254  --   --   --   TROPONINI  --  <0.03 <0.03 <0.03   BNP (last 3 results) No results for input(s): PROBNP in the last 8760 hours. HbA1C: No results for input(s): HGBA1C in the last 72 hours. CBG: No results for  input(s): GLUCAP in the last 168 hours. Lipid Profile: No results for input(s): CHOL, HDL, LDLCALC, TRIG, CHOLHDL, LDLDIRECT in the last 72 hours. Thyroid Function Tests: No results for input(s): TSH, T4TOTAL, FREET4, T3FREE, THYROIDAB in the last 72 hours. Anemia Panel: No results for input(s): VITAMINB12, FOLATE, FERRITIN, TIBC, IRON, RETICCTPCT in the last 72 hours. Sepsis Labs:  Recent Labs Lab 01/30/17 0932  LATICACIDVEN 1.87    Recent Results (from the past 240 hour(s))  Blood culture (routine x 2)     Status: None (Preliminary result)   Collection Time: 01/30/17  9:20 AM  Result Value Ref Range Status   Specimen Description BLOOD LEFT ANTECUBITAL  Final   Special Requests   Final  BOTTLES DRAWN AEROBIC AND ANAEROBIC Blood Culture adequate volume   Culture NO GROWTH < 24 HOURS  Final   Report Status PENDING  Incomplete  Blood culture (routine x 2)     Status: None (Preliminary result)   Collection Time: 01/30/17  9:25 AM  Result Value Ref Range Status   Specimen Description BLOOD LEFT HAND  Final   Special Requests   Final    BOTTLES DRAWN AEROBIC AND ANAEROBIC Blood Culture adequate volume   Culture NO GROWTH < 24 HOURS  Final   Report Status PENDING  Incomplete  Respiratory Panel by PCR     Status: None   Collection Time: 01/30/17  7:51 PM  Result Value Ref Range Status   Adenovirus NOT DETECTED NOT DETECTED Final   Coronavirus 229E NOT DETECTED NOT DETECTED Final   Coronavirus HKU1 NOT DETECTED NOT DETECTED Final   Coronavirus NL63 NOT DETECTED NOT DETECTED Final   Coronavirus OC43 NOT DETECTED NOT DETECTED Final   Metapneumovirus NOT DETECTED NOT DETECTED Final   Rhinovirus / Enterovirus NOT DETECTED NOT DETECTED Final   Influenza A NOT DETECTED NOT DETECTED Final   Influenza B NOT DETECTED NOT DETECTED Final   Parainfluenza Virus 1 NOT DETECTED NOT DETECTED Final   Parainfluenza Virus 2 NOT DETECTED NOT DETECTED Final   Parainfluenza Virus 3 NOT DETECTED NOT  DETECTED Final   Parainfluenza Virus 4 NOT DETECTED NOT DETECTED Final   Respiratory Syncytial Virus NOT DETECTED NOT DETECTED Final   Bordetella pertussis NOT DETECTED NOT DETECTED Final   Chlamydophila pneumoniae NOT DETECTED NOT DETECTED Final   Mycoplasma pneumoniae NOT DETECTED NOT DETECTED Final     Radiology Studies: Dg Chest 2 View  Result Date: 01/30/2017 CLINICAL DATA:  Body aches, rash and fevers, 3 weeks duration. EXAM: CHEST  2 VIEW COMPARISON:  None. FINDINGS: Heart size is normal. Mediastinal shadows are normal. Mild left hilar prominence likely due to pulmonary vessels. The lungs are clear. No effusions. No bone abnormality. IMPRESSION: Normal chest Electronically Signed   By: Paulina FusiMark  Shogry M.D.   On: 01/30/2017 10:01    Scheduled Meds: . enoxaparin (LOVENOX) injection  40 mg Subcutaneous Q24H  . feeding supplement (ENSURE ENLIVE)  237 mL Oral BID BM  . hydrocortisone cream   Topical BID   Continuous Infusions: . sodium chloride 125 mL/hr at 01/31/17 0904     LOS: 1 day   Harbor Paster, Scheryl MartenSTEPHEN K, MD [facility] Pager [phone]  If 7PM-7AM, please contact night-coverage [email or web address] Password [email or web address] 01/31/2017, 2:22 PM

## 2017-01-31 NOTE — Progress Notes (Signed)
[facility]  Date of Admission:  01/30/2017    Total days of antibiotics 0                      ASSESSMENT: FUO. He shows improvement in his CMP with normalization of electrolytes and liver pain, AST remains mildly elevated. He has anemia with hemoglobin at 9.5 and there was some improvement in leukocytosis with IV fluid resuscitation. His respiratory panel and parasitic screening test was negative. He has markedly elevated LDH, heptoglobin results still pending, RSV, EBV, and CMV is still pending. Blood cultures and hepatitis panel was negative. Inflammatory markers are elevated and rheumatoid factor and CK are within normal limits.  PLAN: His acutely elevated LDH, most likely not hemolysis as his bilirubin become normal today, more concerning for hematologic malignancies, if his test for malaria remains negative we will do CT of chest, abdomen and pelvis to look for any malignancy especially lymphoma.  Active Problems:   FUO (fever of unknown origin)   Atypical chest pain   Rash   AKI (acute kidney injury) (HCC)   Hyponatremia   . enoxaparin (LOVENOX) injection  40 mg Subcutaneous Q24H  . feeding supplement (ENSURE ENLIVE)  237 mL Oral BID BM  . hydrocortisone cream   Topical BID    SUBJECTIVE: Patient was feeling better. He has no other complaints.  Review of Systems: ROS  No Known Allergies  OBJECTIVE: Vitals:   01/30/17 2313 01/31/17 0111 01/31/17 0636 01/31/17 0931  BP: 113/61  133/73 112/60  Pulse: (!) 113  98 98  Resp: 18  18 18   Temp: (!) 102.4 F (39.1 C) 98.3 F (36.8 C) (!) 102.6 F (39.2 C) 99.9 F (37.7 C)  TempSrc: Oral Oral Oral Oral  SpO2: 98%  97% 99%  Weight: 170 lb (77.1 kg)     Height:       Body mass index is 30.11 kg/m.  Physical Exam   Gen. Well-developed gentleman, in no acute distress. Lungs. Clear bilaterally. CV. Tachycardia with regular rhythm. Abdomen. Soft, nontender, bowel sounds  positive. Extremities. No edema, no cyanosis, pulses intact and symmetrical. Skin. Multiple hyperpigmented small skin lesions on forearms, one hyperpigmented lesion on his upper back with some recent excoriation marks.  Lab Results Lab Results  Component Value Date   WBC 11.9 (H) 01/31/2017   HGB 9.5 (L) 01/31/2017   HCT 29.6 (L) 01/31/2017   MCV 87.6 01/31/2017   PLT 611 (H) 01/31/2017    Lab Results  Component Value Date   CREATININE 1.18 01/31/2017   BUN 16 01/31/2017   NA 134 (L) 01/31/2017   K 3.9 01/31/2017   CL 101 01/31/2017   CO2 24 01/31/2017    Lab Results  Component Value Date   ALT 28 01/31/2017   AST 86 (H) 01/31/2017   ALKPHOS 72 01/31/2017   BILITOT 0.8 01/31/2017     Microbiology: Recent Results (from the past 240 hour(s))  Blood culture (routine x 2)     Status: None (Preliminary result)   Collection Time: 01/30/17  9:20 AM  Result Value Ref Range Status   Specimen Description BLOOD LEFT ANTECUBITAL  Final   Special Requests   Final    BOTTLES DRAWN AEROBIC AND ANAEROBIC Blood Culture adequate volume   Culture NO GROWTH < 24 HOURS  Final   Report Status PENDING  Incomplete  Blood culture (routine x 2)  Status: None (Preliminary result)   Collection Time: 01/30/17  9:25 AM  Result Value Ref Range Status   Specimen Description BLOOD LEFT HAND  Final   Special Requests   Final    BOTTLES DRAWN AEROBIC AND ANAEROBIC Blood Culture adequate volume   Culture NO GROWTH < 24 HOURS  Final   Report Status PENDING  Incomplete  Respiratory Panel by PCR     Status: None   Collection Time: 01/30/17  7:51 PM  Result Value Ref Range Status   Adenovirus NOT DETECTED NOT DETECTED Final   Coronavirus 229E NOT DETECTED NOT DETECTED Final   Coronavirus HKU1 NOT DETECTED NOT DETECTED Final   Coronavirus NL63 NOT DETECTED NOT DETECTED Final   Coronavirus OC43 NOT DETECTED NOT DETECTED Final   Metapneumovirus NOT DETECTED NOT DETECTED Final   Rhinovirus /  Enterovirus NOT DETECTED NOT DETECTED Final   Influenza A NOT DETECTED NOT DETECTED Final   Influenza B NOT DETECTED NOT DETECTED Final   Parainfluenza Virus 1 NOT DETECTED NOT DETECTED Final   Parainfluenza Virus 2 NOT DETECTED NOT DETECTED Final   Parainfluenza Virus 3 NOT DETECTED NOT DETECTED Final   Parainfluenza Virus 4 NOT DETECTED NOT DETECTED Final   Respiratory Syncytial Virus NOT DETECTED NOT DETECTED Final   Bordetella pertussis NOT DETECTED NOT DETECTED Final   Chlamydophila pneumoniae NOT DETECTED NOT DETECTED Final   Mycoplasma pneumoniae NOT DETECTED NOT DETECTED Final    Arnetha Courser, MD [facility] [phone] pager   [phone] cell 01/31/2017, 2:21 PM

## 2017-02-01 DIAGNOSIS — R7402 Elevation of levels of lactic acid dehydrogenase (LDH): Secondary | ICD-10-CM

## 2017-02-01 DIAGNOSIS — D649 Anemia, unspecified: Secondary | ICD-10-CM

## 2017-02-01 DIAGNOSIS — R740 Nonspecific elevation of levels of transaminase and lactic acid dehydrogenase [LDH]: Secondary | ICD-10-CM

## 2017-02-01 DIAGNOSIS — R590 Localized enlarged lymph nodes: Secondary | ICD-10-CM

## 2017-02-01 LAB — COMPREHENSIVE METABOLIC PANEL
ALBUMIN: 2.4 g/dL — AB (ref 3.5–5.0)
ALK PHOS: 68 U/L (ref 38–126)
ALT: 27 U/L (ref 17–63)
ANION GAP: 8 (ref 5–15)
AST: 90 U/L — AB (ref 15–41)
BUN: 8 mg/dL (ref 6–20)
CALCIUM: 8.6 mg/dL — AB (ref 8.9–10.3)
CO2: 22 mmol/L (ref 22–32)
Chloride: 108 mmol/L (ref 101–111)
Creatinine, Ser: 1.05 mg/dL (ref 0.61–1.24)
GFR calc Af Amer: >60 mL/min (ref >60)
GFR calc non Af Amer: >60 mL/min (ref >60)
Glucose, Bld: 92 mg/dL (ref 65–99)
Potassium: 3.9 mmol/L (ref 3.5–5.1)
SODIUM: 138 mmol/L (ref 135–145)
Total Bilirubin: 0.5 mg/dL (ref 0.3–1.2)
Total Protein: 6.6 g/dL (ref 6.5–8.1)

## 2017-02-01 LAB — CBC WITH DIFFERENTIAL/PLATELET
BAND NEUTROPHILS: 15 %
BASOS PCT: 0 %
Basophils Absolute: 0 10*3/uL (ref 0.0–0.1)
Blasts: 0 %
EOS ABS: 1.9 10*3/uL — AB (ref 0.0–0.7)
Eosinophils Relative: 17 %
HCT: 28.5 % — ABNORMAL LOW (ref 39.0–52.0)
Hemoglobin: 9.2 g/dL — ABNORMAL LOW (ref 13.0–17.0)
LYMPHS PCT: 24 %
Lymphs Abs: 2.7 10*3/uL (ref 0.7–4.0)
MCH: 28.7 pg (ref 26.0–34.0)
MCHC: 32.3 g/dL (ref 30.0–36.0)
MCV: 88.8 fL (ref 78.0–100.0)
METAMYELOCYTES PCT: 0 %
MONO ABS: 0.3 10*3/uL (ref 0.1–1.0)
MONOS PCT: 3 %
Myelocytes: 0 %
NEUTROS ABS: 6.4 10*3/uL (ref 1.7–7.7)
Neutrophils Relative %: 41 %
OTHER: 0 %
PLATELETS: 629 10*3/uL — AB (ref 150–400)
PROMYELOCYTES ABS: 0 %
RBC: 3.21 MIL/uL — ABNORMAL LOW (ref 4.22–5.81)
RDW: 13.4 % (ref 11.5–15.5)
WBC: 11.3 10*3/uL — ABNORMAL HIGH (ref 4.0–10.5)
nRBC: 0 /100 WBC

## 2017-02-01 LAB — HAPTOGLOBIN: Haptoglobin: 387 mg/dL — ABNORMAL HIGH (ref 34–200)

## 2017-02-01 LAB — FERRITIN: Ferritin: >7500 ng/mL — ABNORMAL HIGH (ref 24–336)

## 2017-02-01 LAB — RSV(RESPIRATORY SYNCYTIAL VIRUS) AB, BLOOD

## 2017-02-01 MED ORDER — IBUPROFEN 400 MG PO TABS
400.0000 mg | ORAL_TABLET | Freq: Four times a day (QID) | ORAL | Status: DC | PRN
  Administered 2017-02-01 (×2): 400 mg via ORAL
  Filled 2017-02-01 (×2): qty 1

## 2017-02-01 MED ORDER — ENOXAPARIN SODIUM 40 MG/0.4ML ~~LOC~~ SOLN: 40.0000 mg | SUBCUTANEOUS | Status: DC

## 2017-02-01 MED ORDER — IBUPROFEN 400 MG PO TABS: 400.0000 mg | ORAL_TABLET | ORAL | Status: DC | PRN

## 2017-02-01 MED ORDER — IBUPROFEN 400 MG PO TABS: 400.0000 mg | ORAL_TABLET | Freq: Four times a day (QID) | ORAL | Status: DC | PRN

## 2017-02-01 NOTE — Progress Notes (Signed)
Notified MD Chiu of temp 101.5. Tylenol given at 0743.   Awaiting orders  Leonia ReevesNatalie N Hurschel Paynter, RN

## 2017-02-01 NOTE — Consult Note (Signed)
Referring MD: Dr. Scharlene Gloss  Hematology consultation  PCP:  Patient, No Pcp Per   Reason for Referral: Fever of unknown origin, anemia, significant elevation of LDH  Chief Complaint  Patient presents with  . Generalized Body Aches  Patient interviewed with the assistance of a Skype [place] translator  HPI: 26 year old man originally from [place] who has lived in this country for 2 years.  He works as a Patent examiner.  He has been in overall excellent health without any major medical or surgical illness.  He presented on the day of admission July 18 with a three-week history of subjective fevers, mild myalgias, a dermatitis on his anterior chest and upper back, pain and swelling of his hands primarily affecting the middle digit on each hand.  He denied any significant arthralgias, headache, change in vision, sore throat, nausea, vomiting, diarrhea, epistaxis, hematuria, hematochezia.  He did have some spontaneous bruising on his lower extremities.  He believes he probably has lost some weight.  No decreased appetite.  No drenching night sweats. He denied any history of hepatitis, yellow jaundice, malaria, heterosexual or homosexual sex. He lives with 3 roommates.  One man travels a lot and always comes home  sick but has not seen a doctor.  No one at work is ill.  The patient himself has not had any foreign travel since he came here 2 years ago. He denies any family history of any serious medical illness and his parents or 3 siblings. He denies any alcohol or recreational drug use specifically no use of marijuana, cocaine, or heroin. He is a non-smoker.  Evaluation to date shows hectic fevers as high as 103 daily. Hepatitis A, B, C, HIV negative.  RPR negative.  EBV and CMV elevated IgG but not IgM titers. Parasite screen on the blood negative. Blood cultures negative at 48 hours. Serum LDH elevated at 1165.  SGOT 90, SGPT 27, Ruben 0.5, total protein 6.6, albumin 2.4,  calcium 8.6.  9.6 corrected for low albumin.  BUN 8, creatinine 1.0. Rheumatoid factor negative. Haptoglobin 387 ESR 123 mm  CT scan chest abdomen and pelvis which I have not yet personally reviewed.  There is a small 5 mm left upper lung nodule.  No other pulmonary pathology.  Multiple upper limit of normal axillary lymph nodes with fatty hila likely reactive.  Multiple small nodular densities in the subcutaneous soft tissues left anterior chest wall indeterminant etiology.  No mediastinal abdominal or retroperitoneal adenopathy.  Spleen is normal.  Mild fatty infiltration of the liver.     Past Medical History:  Diagnosis Date  . Chronic lower back pain   . FUO (fever of unknown origin) 01/30/2017  :  Past Surgical History:  Procedure Laterality Date  . NO PAST SURGERIES    :  . enoxaparin (LOVENOX) injection  40 mg Subcutaneous Q24H  . [START ON 02/04/2017] enoxaparin (LOVENOX) injection  40 mg Subcutaneous Q24H  . feeding supplement (ENSURE ENLIVE)  237 mL Oral BID BM  . hydrocortisone cream   Topical BID  :  No Known Allergies:  Family History  Problem Relation Age of Onset  . Stroke Neg Hx   . Heart disease Neg Hx   . Diabetes Neg Hx   :  Social History   Social History  . Marital status: Single    Spouse name: N/A  . Number of children: N/A  . Years of education: N/A   Occupational History  . Not on  file.   Social History Main Topics  . Smoking status: Never Smoker  . Smokeless tobacco: Never Used  . Alcohol use 1.8 oz/week    3 Glasses of wine per week  . Drug use: No  . Sexual activity: No   Other Topics Concern  . Not on file   Social History Narrative  . No narrative on file  :  ROS: See HPI. Review of systems otherwise negative.  Vitals: Vitals:   02/01/17 1211 02/01/17 1720  BP:  (!) 102/58  Pulse:  (!) 101  Resp:  18  Temp: 98.4 F (36.9 C) 98.8 F (37.1 C)    PHYSICAL EXAM: General appearance: Well-nourished African-American  man HEENT: Neck is supple.  Pharynx without erythema exudate or mass. Lymph Nodes: No cervical, supraclavicular, or left axillary adenopathy.  Approximate 3 cm lymph node palpable in the right axilla.  No inguinal adenopathy Resp: Lungs are clear to auscultation and resonant to percussion throughout Cardio: Regular cardiac rhythm without murmur gallop or rub Vascular: Carotids 2+ no bruits Breasts: GI: Abdomen soft, distended, nontender, no palpable mass or organomegaly GU: Circumcised penis.  No testicular masses.  No inguinal adenopathy Extremities: No edema, cyanosis, or calf tenderness.  Fusiform swelling of the middle digit on both hands. Rectal exam not done Neurologic: Mental status intact, cranial nerves grossly normal, motor strength 5/5, reflexes 2+ symmetric, upper body coordination normal, gait not tested Skin: Sandpaper like diffuse hyperpigmented dermatitis in the bib area of the anterior chest and on the upper back  Labs: See above   Recent Labs  01/31/17 0534 02/01/17 0435  WBC 11.9* 11.3*  HGB 9.5* 9.2*  HCT 29.6* 28.5*  PLT 611* 629*    Recent Labs  01/31/17 0534 02/01/17 0435  NA 134* 138  K 3.9 3.9  CL 101 108  CO2 24 22  GLUCOSE 93 92  BUN 16 8  CREATININE 1.18 1.05  CALCIUM 8.5* 8.6*    Blood smear review: Normochromic normocytic red cells.  No spherocytes, schistocytes, polychromasia, or red cell inclusions.  No obvious parasites in the red cells.  A number of nucleated red blood cells observed.  Significant thrombocytosis.  Eosinophilia.  Neutrophils overall normal and lobation and granulation.  Rare cell with toxic granulations.  No Dohle bodies are vacuoles.  There is a rare myelocyte and metamyelocyte.  No myeloblasts. Subpopulation of reactive lymphocytes with lobulated nuclei and abundant cytoplasm.  A single more immature appearing lymphocyte seen.  Images Studies/Results:  Ct Chest W Contrast  Result Date: 02/01/2017 CLINICAL DATA:   26 year old male with fever of unknown origin. EXAM: CT CHEST, ABDOMEN, AND PELVIS WITH CONTRAST TECHNIQUE: Multidetector CT imaging of the chest, abdomen and pelvis was performed following the standard protocol during bolus administration of intravenous contrast. CONTRAST:  126m ISOVUE-300 IOPAMIDOL (ISOVUE-300) INJECTION 61% COMPARISON:  Chest radiograph dated 01/30/2017 FINDINGS: CT CHEST FINDINGS Cardiovascular: There is no cardiomegaly or pericardial effusion. The thoracic aorta is unremarkable. The origins of the great vessels of the aortic arch are patent. The central pulmonary arteries appear unremarkable. Mediastinum/Nodes: There is no hilar or mediastinal adenopathy. The esophagus and the thyroid gland are grossly unremarkable. No mediastinal fluid collection or hematoma. Lungs/Pleura: There is a 5 mm left upper lobe nodule (series 7, image 65). There is no focal consolidation, pleural effusion, or pneumothorax. The central airways are patent. Musculoskeletal: Multiple top-normal axillary lymph nodes which demonstrate fatty hilum, likely reactive. Clinical correlation is recommended. There is thickening of the skin in  the axilla. No fluid collection. Multiple small nodular density in the subcutaneous soft tissues of the left anterior chest wall (series 3, image 26) of indeterminate etiology, likely normal appearing lymph nodes. Nonenlarged left supraclavicular lymph node measuring 7 mm in short axis. The chest wall soft tissues are otherwise unremarkable. The osseous structures are intact. CT ABDOMEN PELVIS FINDINGS Evaluation of this exam is limited due to respiratory motion artifact. There is no intra-abdominal free air or free fluid. Hepatobiliary: Probable mild fatty infiltration of the liver. No intrahepatic biliary ductal dilatation. The gallbladder is unremarkable. Pancreas: Unremarkable. No pancreatic ductal dilatation or surrounding inflammatory changes. Spleen: Normal in size without focal  abnormality. Adrenals/Urinary Tract: Adrenal glands are unremarkable. Kidneys are normal, without renal calculi, focal lesion, or hydronephrosis. Bladder is unremarkable. Stomach/Bowel: There is loose stool within the colon compatible with diarrheal state. Correlation with clinical exam and stool cultures recommended. Multiple normal caliber fluid-filled loops of small bowel throughout the abdomen noted which may be physiologic although enteritis is not excluded. Clinical correlation is recommended. There is no bowel obstruction. Normal appendix. Vascular/Lymphatic: No significant vascular findings are present. No enlarged abdominal or pelvic lymph nodes. Reproductive: The prostate and seminal vesicles are grossly unremarkable. Other: There is diastases of anterior abdominal wall musculature with a broad-based fat containing umbilical hernia. No fluid collection. Musculoskeletal: No acute or significant osseous findings. IMPRESSION: 1. A 5 mm left upper lobe nodule. No follow-up needed if patient is low-risk. Non-contrast chest CT can be considered in 12 months if patient is high-risk. This recommendation follows the consensus statement: Guidelines for Management of Incidental Pulmonary Nodules Detected on CT Images: From the Fleischner Society 2017; Radiology 2017; 284:228-243. 2. Top-normal bilateral axillary lymph nodes which demonstrate normal fatty hilum, likely reactive. Clinical correlation is recommended. 3. Fatty liver. 4. Fluid field loops of normal caliber small bowel may be physiologic or represent enteritis. Clinical correlation is recommended. 5. Diarrheal state. Correlation with clinical exam and stool cultures recommended. No bowel obstruction. Normal appendix. Electronically Signed   By: Anner Crete M.D.   On: 02/01/2017 06:35    Impression: Signs and symptoms of an intense inflammatory versus neoplastic process.  Toxic appearing peripheral blood film with nucleated red blood cells,  reactive lymphocytes, & early myeloid cells are indicators of intense marrow stress.  This can be seen with infection or infiltrative disease of the marrow. Significant elevation of serum LDH in the absence of any positive cultures for infectious organisms or focal abnormalities of the liver raises concern for an underlying hematologic malignancy.   Cutaneous rash, hypereosinophilia, intense inflammatory reaction, hypoalbuminemia, are frequent findings with T-cell lymphomas including HTLV-1 disease and angioimmunoblastic lymphadenopathy.  The intense inflammatory response that we are seeing in this patient also suggests possibility of [place], hemophagocytic lymphocytic histiocytosis syndrome.  Recommendation: I feel we need to do bone marrow aspiration and biopsy at this time for cultures and to rule out hematologic malignancy.  If the bone marrow is not diagnostic, I would do an excisional biopsy the right axillary lymph node, (not a needle biopsy!). I am going to check a serum ferritin which is typically markedly elevated with HLH syndrome.  Additional diagnostic findings would be seen in the bone marrow. HTLV-1 status is pending. I am checking an SPEP and immunofixation electrophoresis.  There is frequently a polyclonal hypergammaglobulinemia, skin rash, hypoalbuminemia, and electrolyte abnormalities associated with  AILD (Angioimmunoblastic lymphadenopathy-T-cell lymphoma)  The patient agreed to proceed with the bone marrow biopsy.  I reviewed technically  how this would be done.  He also agreed to a axillary lymph node biopsy if necessary.    Murriel Hopper, MD, [place]  Hematology-Oncology/Internal Medicine    02/01/2017, 5:42 PM

## 2017-02-01 NOTE — Progress Notes (Signed)
[facility]  Date of Admission:  01/30/2017    Total days of antibiotics 0          ASSESSMENT: FUO. He continued to spike fever. CT chest abdomen and pelvis only shows some enlargement of axillary lymph nodes which looks more reactive and some loose stools in bowel. He is having an elevated haptoglobin along with elevated LDH which Drive [place] away from any  Hemolysis. Haptoglobin is also an acute phase reactant. He is positive for EBV IgG and CMV IgG, both IgM are negative which are consistent with any previous infection. I am more concerned that he has some sort of hematologic malignancy.   PLAN: 1. Pathologic review of blood smear is still pending. 2. Continue symptomatic management.  Active Problems:   FUO (fever of unknown origin)   Atypical chest pain   Rash   AKI (acute kidney injury) (HCC)   Hyponatremia   . enoxaparin (LOVENOX) injection  40 mg Subcutaneous Q24H  . feeding supplement (ENSURE ENLIVE)  237 mL Oral BID BM  . hydrocortisone cream   Topical BID    SUBJECTIVE: Patient has no new complaints. He denies any more knee pain. He was also denying any abdominal pain or diarrhea. He was having contractures of middle fingers bilaterally but denies any persistent pain. He continued to spike fever.  Review of Systems: ROS  No Known Allergies  OBJECTIVE: Vitals:   02/01/17 0200 02/01/17 0549 02/01/17 0730 02/01/17 0900  BP:  (!) 103/56 106/62   Pulse:  93 98   Resp:  17 16   Temp: (!) 100.7 F (38.2 C) 100 F (37.8 C) (!) 101.2 F (38.4 C) (S) (!) 101.5 F (38.6 C)  TempSrc: Oral  Oral Oral  SpO2:  100% 98%   Weight:      Height:       Body mass index is 30.01 kg/m.  Physical Exam  Gen. Well-developed gentleman, Lying comfortably in his bed ,in no acute distress. Lungs. Clear bilaterally. CV. Tachycardia with regular rhythm. Abdomen. Soft, nontender, bowel sounds positive. Extremities. No edema, no cyanosis,  pulses intact and symmetrical. Skin. Multiple hyperpigmented small skin lesions on forearms, one hyperpigmented lesion on his upper back with some recent excoriation marks.  Lab Results Lab Results  Component Value Date   WBC 11.3 (H) 02/01/2017   HGB 9.2 (L) 02/01/2017   HCT 28.5 (L) 02/01/2017   MCV 88.8 02/01/2017   PLT 629 (H) 02/01/2017    Lab Results  Component Value Date   CREATININE 1.05 02/01/2017   BUN 8 02/01/2017   NA 138 02/01/2017   K 3.9 02/01/2017   CL 108 02/01/2017   CO2 22 02/01/2017    Lab Results  Component Value Date   ALT 27 02/01/2017   AST 90 (H) 02/01/2017   ALKPHOS 68 02/01/2017   BILITOT 0.5 02/01/2017     Microbiology: Recent Results (from the past 240 hour(s))  Blood culture (routine x 2)     Status: None (Preliminary result)   Collection Time: 01/30/17  9:20 AM  Result Value Ref Range Status   Specimen Description BLOOD LEFT ANTECUBITAL  Final   Special Requests   Final    BOTTLES DRAWN AEROBIC AND ANAEROBIC Blood Culture adequate volume   Culture NO GROWTH 2 DAYS  Final   Report Status PENDING  Incomplete  Blood culture (routine x 2)     Status: None (Preliminary result)  Collection Time: 01/30/17  9:25 AM  Result Value Ref Range Status   Specimen Description BLOOD LEFT HAND  Final   Special Requests   Final    BOTTLES DRAWN AEROBIC AND ANAEROBIC Blood Culture adequate volume   Culture NO GROWTH 2 DAYS  Final   Report Status PENDING  Incomplete  Respiratory Panel by PCR     Status: None   Collection Time: 01/30/17  7:51 PM  Result Value Ref Range Status   Adenovirus NOT DETECTED NOT DETECTED Final   Coronavirus 229E NOT DETECTED NOT DETECTED Final   Coronavirus HKU1 NOT DETECTED NOT DETECTED Final   Coronavirus NL63 NOT DETECTED NOT DETECTED Final   Coronavirus OC43 NOT DETECTED NOT DETECTED Final   Metapneumovirus NOT DETECTED NOT DETECTED Final   Rhinovirus / Enterovirus NOT DETECTED NOT DETECTED Final   Influenza A NOT  DETECTED NOT DETECTED Final   Influenza B NOT DETECTED NOT DETECTED Final   Parainfluenza Virus 1 NOT DETECTED NOT DETECTED Final   Parainfluenza Virus 2 NOT DETECTED NOT DETECTED Final   Parainfluenza Virus 3 NOT DETECTED NOT DETECTED Final   Parainfluenza Virus 4 NOT DETECTED NOT DETECTED Final   Respiratory Syncytial Virus NOT DETECTED NOT DETECTED Final   Bordetella pertussis NOT DETECTED NOT DETECTED Final   Chlamydophila pneumoniae NOT DETECTED NOT DETECTED Final   Mycoplasma pneumoniae NOT DETECTED NOT DETECTED Final    Arnetha CourserSumayya Ruthann Angulo, MD [facility] [phone] pager   [phone] cell 02/01/2017, 11:47 AM

## 2017-02-01 NOTE — Progress Notes (Signed)
PROGRESS NOTE    Carlos Holder  ZOX:[id number] DOB: [birth year] DOA: 01/30/2017 PCP: Patient, No Pcp Per    Brief Narrative:  26 y.o. male with no significant past medical history. [place]. Patient states he is not up-to-date on any of his immunizations. Patient was in a home with 3 other individuals from [place]. All others in the home are healthy but is unsure of their immunization status. No individuals in the home have traveled outside the country in the last several months.  Patient was in his normal state of health until approximately 3 weeks prior to admission when he developed intermittent knee pain. Seemed to have a waxing and waning nature but was worse with ambulation. This would occur approximately 3-4 days out of the week. Patient unable to identify any trigger or alleviating factor associated with the pain other than rest. (Level V caveat applies as patient encounter was dictated by [place] interpreter with some difficulty. For example through the interpreter pt stated he had foot pain but was clearly pointing to his knee.). Patient states he has also felt warm over this period of time and has also felt itchy. Patient does endorse chest pain which seems to be described as a deep feeling of pain in his chest. This pain is nonexertional and is not associated with shortness of breath, diaphoresis, nausea or radiation to the neck arm or back. This pain is constant and mild. Rash on upper chest started approximately 1 week ago gradually across upper chest, arms and upper back. Patient denies ever being sexually active. Denies nausea, vomiting, abdominal pain, dysuria, frequency, penile discharge, groin adenopathy, neck stiffness, headache, LOC, focal neurological deficit. Patient does endorse approximately 2 week history of bilateral middle finger swelling and pain. Constant.  Assessment & Plan:   Active Problems:  FUO (fever of unknown origin)   Atypical chest pain   Rash   AKI (acute kidney injury) (HCC)   Hyponatremia  FUO:  - Patient with presenting rash on arms and chest/back, B knee pain w/o swelling, B middle finger swelling and pain, and fever for 3 weeks, presenting WBC 14.6. -ID following. Discussed case with Infectious Disease. Recommendation for CT chest, abd/pelvis with contrast to r/o underlying pathology -Labs reviewed. WBC improved. Continues to be febrile -Thus far, blood cx neg, hepatitis, HIV neg. Per ID, malaria screen preliminary neg. -Continues to be febrile. Have added ibuprofen PRN fevers -Had discussed with ID, recommendations for chest, abd/pelvis CT. Reviewed. No significant findings noted. -ID had discussed case with Hematology who will see patient later today  Chest pain:  -Continued on tele -Currently stable at this time  Rash:  - Likely due to cause of FUO.  - Patient continued on hydrocortisone 1% cream for itch relief  AKI: Presenting Cr 1.33. Likely from dehydration from very poor oral intake for several days. - Renal function has improved with IVF - Will check CMP in AM  Hyponatremia: 128. Likely from poor oral intake for several days w/ limited free water intake.  - normalized - repeat lytes in AM  DVT prophylaxis:  Lovenox subQ Code Status: Full Family Communication: Pt in room, family not at bedside Disposition Plan: Uncertain at this time  Consultants:   ID  Hematology  Procedures:     Antimicrobials: Anti-infectives    None      Subjective: Without complaints. Denies diarrhea  Objective: Vitals:   02/01/17 0549 02/01/17  0730 02/01/17 0900 02/01/17 1211  BP: (!) 103/56 106/62    Pulse: 93 98    Resp: 17 16    Temp: 100 F (37.8 C) (!) 101.2 F (38.4 C) (S) (!) 101.5 F (38.6 C) 98.4 F (36.9 C)  TempSrc:  Oral Oral   SpO2: 100% 98%    Weight:      Height:        Intake/Output Summary (Last 24 hours) at 02/01/17  1456 Last data filed at 02/01/17 1055  Gross per 24 hour  Intake           4152.5 ml  Output              675 ml  Net           3477.5 ml   Filed Weights   01/30/17 0848 01/30/17 2313 01/31/17 2009  Weight: 80 kg (176 lb 5.9 oz) 77.1 kg (170 lb) 76.8 kg (169 lb 6.4 oz)    Examination: General exam: Awake, laying in bed, in nad Respiratory system: Normal respiratory effort, no wheezing Cardiovascular system: regular rate, s1, s2 Gastrointestinal system: Soft, nondistended, positive BS Central nervous system: CN2-12 grossly intact, strength intact Extremities: Perfused, no clubbing Skin: Normal skin turgor, no notable skin lesions seen Psychiatry: Mood normal // no visual hallucinations    Data Reviewed: I have personally reviewed following labs and imaging studies  CBC:  Recent Labs Lab 01/30/17 0925 01/31/17 0534 02/01/17 0435  WBC 14.6* 11.9* 11.3*  NEUTROABS 12.3*  --  6.4  HGB 11.2* 9.5* 9.2*  HCT 34.7* 29.6* 28.5*  MCV 85.5 87.6 88.8  PLT 638* 611* 629*   Basic Metabolic Panel:  Recent Labs Lab 01/30/17 0925 01/31/17 0534 02/01/17 0435  NA 128* 134* 138  K 3.3* 3.9 3.9  CL 95* 101 108  CO2 19* 24 22  GLUCOSE 99 93 92  BUN 20 16 8   CREATININE 1.33* 1.18 1.05  CALCIUM 9.0 8.5* 8.6*   GFR: Estimated Creatinine Clearance: 97 mL/min (by C-G formula based on SCr of 1.05 mg/dL). Liver Function Tests:  Recent Labs Lab 01/30/17 0925 01/31/17 0534 02/01/17 0435  AST 95* 86* 90*  ALT 32 28 27  ALKPHOS 89 72 68  BILITOT 1.4* 0.8 0.5  PROT 8.0 6.9 6.6  ALBUMIN 2.9* 2.3* 2.4*   No results for input(s): LIPASE, AMYLASE in the last 168 hours. No results for input(s): AMMONIA in the last 168 hours. Coagulation Profile: No results for input(s): INR, PROTIME in the last 168 hours. Cardiac Enzymes:  Recent Labs Lab 01/30/17 0925 01/30/17 1135 01/30/17 1907 01/30/17 2313  CKTOTAL 254  --   --   --   TROPONINI  --  <0.03 <0.03 <0.03   BNP (last 3  results) No results for input(s): PROBNP in the last 8760 hours. HbA1C: No results for input(s): HGBA1C in the last 72 hours. CBG: No results for input(s): GLUCAP in the last 168 hours. Lipid Profile: No results for input(s): CHOL, HDL, LDLCALC, TRIG, CHOLHDL, LDLDIRECT in the last 72 hours. Thyroid Function Tests: No results for input(s): TSH, T4TOTAL, FREET4, T3FREE, THYROIDAB in the last 72 hours. Anemia Panel: No results for input(s): VITAMINB12, FOLATE, FERRITIN, TIBC, IRON, RETICCTPCT in the last 72 hours. Sepsis Labs:  Recent Labs Lab 01/30/17 0932  LATICACIDVEN 1.87    Recent Results (from the past 240 hour(s))  Blood culture (routine x 2)     Status: None (Preliminary result)   Collection Time: 01/30/17  9:20 AM  Result Value Ref Range Status   Specimen Description BLOOD LEFT ANTECUBITAL  Final   Special Requests   Final    BOTTLES DRAWN AEROBIC AND ANAEROBIC Blood Culture adequate volume   Culture NO GROWTH 2 DAYS  Final   Report Status PENDING  Incomplete  Blood culture (routine x 2)     Status: None (Preliminary result)   Collection Time: 01/30/17  9:25 AM  Result Value Ref Range Status   Specimen Description BLOOD LEFT HAND  Final   Special Requests   Final    BOTTLES DRAWN AEROBIC AND ANAEROBIC Blood Culture adequate volume   Culture NO GROWTH 2 DAYS  Final   Report Status PENDING  Incomplete  Respiratory Panel by PCR     Status: None   Collection Time: 01/30/17  7:51 PM  Result Value Ref Range Status   Adenovirus NOT DETECTED NOT DETECTED Final   Coronavirus 229E NOT DETECTED NOT DETECTED Final   Coronavirus HKU1 NOT DETECTED NOT DETECTED Final   Coronavirus NL63 NOT DETECTED NOT DETECTED Final   Coronavirus OC43 NOT DETECTED NOT DETECTED Final   Metapneumovirus NOT DETECTED NOT DETECTED Final   Rhinovirus / Enterovirus NOT DETECTED NOT DETECTED Final   Influenza A NOT DETECTED NOT DETECTED Final   Influenza B NOT DETECTED NOT DETECTED Final    Parainfluenza Virus 1 NOT DETECTED NOT DETECTED Final   Parainfluenza Virus 2 NOT DETECTED NOT DETECTED Final   Parainfluenza Virus 3 NOT DETECTED NOT DETECTED Final   Parainfluenza Virus 4 NOT DETECTED NOT DETECTED Final   Respiratory Syncytial Virus NOT DETECTED NOT DETECTED Final   Bordetella pertussis NOT DETECTED NOT DETECTED Final   Chlamydophila pneumoniae NOT DETECTED NOT DETECTED Final   Mycoplasma pneumoniae NOT DETECTED NOT DETECTED Final     Radiology Studies: Ct Chest W Contrast  Result Date: 02/01/2017 CLINICAL DATA:  26 year old male with fever of unknown origin. EXAM: CT CHEST, ABDOMEN, AND PELVIS WITH CONTRAST TECHNIQUE: Multidetector CT imaging of the chest, abdomen and pelvis was performed following the standard protocol during bolus administration of intravenous contrast. CONTRAST:  ISOVUE-300 IOPAMIDOL (ISOVUE-300) INJECTION 61% COMPARISON:  Chest radiograph dated 01/30/2017 FINDINGS: CT CHEST FINDINGS Cardiovascular: There is no cardiomegaly or pericardial effusion. The thoracic aorta is unremarkable. The origins of the great vessels of the aortic arch are patent. The central pulmonary arteries appear unremarkable. Mediastinum/Nodes: There is no hilar or mediastinal adenopathy. The esophagus and the thyroid gland are grossly unremarkable. No mediastinal fluid collection or hematoma. Lungs/Pleura: There is a 5 mm left upper lobe nodule (series 7, image 65). There is no focal consolidation, pleural effusion, or pneumothorax. The central airways are patent. Musculoskeletal: Multiple top-normal axillary lymph nodes which demonstrate fatty hilum, likely reactive. Clinical correlation is recommended. There is thickening of the skin in the axilla. No fluid collection. Multiple small nodular density in the subcutaneous soft tissues of the left anterior chest wall (series 3, image 26) of indeterminate etiology, likely normal appearing lymph nodes. Nonenlarged left supraclavicular  lymph node measuring 7 mm in short axis. The chest wall soft tissues are otherwise unremarkable. The osseous structures are intact. CT ABDOMEN PELVIS FINDINGS Evaluation of this exam is limited due to respiratory motion artifact. There is no intra-abdominal free air or free fluid. Hepatobiliary: Probable mild fatty infiltration of the liver. No intrahepatic biliary ductal dilatation. The gallbladder is unremarkable. Pancreas: Unremarkable. No pancreatic ductal dilatation or surrounding inflammatory changes. Spleen: Normal in size without focal abnormality. Adrenals/Urinary  Tract: Adrenal glands are unremarkable. Kidneys are normal, without renal calculi, focal lesion, or hydronephrosis. Bladder is unremarkable. Stomach/Bowel: There is loose stool within the colon compatible with diarrheal state. Correlation with clinical exam and stool cultures recommended. Multiple normal caliber fluid-filled loops of small bowel throughout the abdomen noted which may be physiologic although enteritis is not excluded. Clinical correlation is recommended. There is no bowel obstruction. Normal appendix. Vascular/Lymphatic: No significant vascular findings are present. No enlarged abdominal or pelvic lymph nodes. Reproductive: The prostate and seminal vesicles are grossly unremarkable. Other: There is diastases of anterior abdominal wall musculature with a broad-based fat containing umbilical hernia. No fluid collection. Musculoskeletal: No acute or significant osseous findings. IMPRESSION: 1. A 5 mm left upper lobe nodule. No follow-up needed if patient is low-risk. Non-contrast chest CT can be considered in 12 months if patient is high-risk. This recommendation follows the consensus statement: Guidelines for Management of Incidental Pulmonary Nodules Detected on CT Images: From the Fleischner Society 2017; Radiology 2017; 284:228-243. 2. Top-normal bilateral axillary lymph nodes which demonstrate normal fatty hilum, likely reactive.  Clinical correlation is recommended. 3. Fatty liver. 4. Fluid field loops of normal caliber small bowel may be physiologic or represent enteritis. Clinical correlation is recommended. 5. Diarrheal state. Correlation with clinical exam and stool cultures recommended. No bowel obstruction. Normal appendix. Electronically Signed   By: Elgie Collard M.D.   On: 02/01/2017 06:35   Ct Abdomen Pelvis W Contrast  Result Date: 02/01/2017 CLINICAL DATA:  26 year old male with fever of unknown origin. EXAM: CT CHEST, ABDOMEN, AND PELVIS WITH CONTRAST TECHNIQUE: Multidetector CT imaging of the chest, abdomen and pelvis was performed following the standard protocol during bolus administration of intravenous contrast. CONTRAST:  ISOVUE-300 IOPAMIDOL (ISOVUE-300) INJECTION 61% COMPARISON:  Chest radiograph dated 01/30/2017 FINDINGS: CT CHEST FINDINGS Cardiovascular: There is no cardiomegaly or pericardial effusion. The thoracic aorta is unremarkable. The origins of the great vessels of the aortic arch are patent. The central pulmonary arteries appear unremarkable. Mediastinum/Nodes: There is no hilar or mediastinal adenopathy. The esophagus and the thyroid gland are grossly unremarkable. No mediastinal fluid collection or hematoma. Lungs/Pleura: There is a 5 mm left upper lobe nodule (series 7, image 65). There is no focal consolidation, pleural effusion, or pneumothorax. The central airways are patent. Musculoskeletal: Multiple top-normal axillary lymph nodes which demonstrate fatty hilum, likely reactive. Clinical correlation is recommended. There is thickening of the skin in the axilla. No fluid collection. Multiple small nodular density in the subcutaneous soft tissues of the left anterior chest wall (series 3, image 26) of indeterminate etiology, likely normal appearing lymph nodes. Nonenlarged left supraclavicular lymph node measuring 7 mm in short axis. The chest wall soft tissues are otherwise unremarkable. The  osseous structures are intact. CT ABDOMEN PELVIS FINDINGS Evaluation of this exam is limited due to respiratory motion artifact. There is no intra-abdominal free air or free fluid. Hepatobiliary: Probable mild fatty infiltration of the liver. No intrahepatic biliary ductal dilatation. The gallbladder is unremarkable. Pancreas: Unremarkable. No pancreatic ductal dilatation or surrounding inflammatory changes. Spleen: Normal in size without focal abnormality. Adrenals/Urinary Tract: Adrenal glands are unremarkable. Kidneys are normal, without renal calculi, focal lesion, or hydronephrosis. Bladder is unremarkable. Stomach/Bowel: There is loose stool within the colon compatible with diarrheal state. Correlation with clinical exam and stool cultures recommended. Multiple normal caliber fluid-filled loops of small bowel throughout the abdomen noted which may be physiologic although enteritis is not excluded. Clinical correlation is recommended. There is no bowel  obstruction. Normal appendix. Vascular/Lymphatic: No significant vascular findings are present. No enlarged abdominal or pelvic lymph nodes. Reproductive: The prostate and seminal vesicles are grossly unremarkable. Other: There is diastases of anterior abdominal wall musculature with a broad-based fat containing umbilical hernia. No fluid collection. Musculoskeletal: No acute or significant osseous findings. IMPRESSION: 1. A 5 mm left upper lobe nodule. No follow-up needed if patient is low-risk. Non-contrast chest CT can be considered in 12 months if patient is high-risk. This recommendation follows the consensus statement: Guidelines for Management of Incidental Pulmonary Nodules Detected on CT Images: From the Fleischner Society 2017; Radiology 2017; 284:228-243. 2. Top-normal bilateral axillary lymph nodes which demonstrate normal fatty hilum, likely reactive. Clinical correlation is recommended. 3. Fatty liver. 4. Fluid field loops of normal caliber small  bowel may be physiologic or represent enteritis. Clinical correlation is recommended. 5. Diarrheal state. Correlation with clinical exam and stool cultures recommended. No bowel obstruction. Normal appendix. Electronically Signed   By: Elgie Collard M.D.   On: 02/01/2017 06:35    Scheduled Meds: . enoxaparin (LOVENOX) injection  40 mg Subcutaneous Q24H  . feeding supplement (ENSURE ENLIVE)  237 mL Oral BID BM  . hydrocortisone cream   Topical BID   Continuous Infusions: . sodium chloride 125 mL/hr at 02/01/17 1055     LOS: 2 days   Lorene Klimas, Scheryl Marten, MD [facility] Pager [phone]  If 7PM-7AM, please contact night-coverage [email or web address] Password [email or web address] 02/01/2017, 2:56 PM

## 2017-02-01 NOTE — Consult Note (Signed)
Chief Complaint: Patient was seen in consultation today for bone marrow biopsy Chief Complaint  Patient presents with  . Generalized Body Aches   at the request of Dr Rhetta Mura  Supervising Physician: Jacqulynn Cadet  Patient Status: [facility] - In-pt  History of Present Illness: Carlos Holder is a 27 y.o. male   Fever of unknown origin Skin rash; pruritis Knee and finger joint pain and swelling Deep chest pain Immigrant from [place] Not up to date on immunizations -Thus far, blood cx neg, hepatitis, HIV neg. Per ID, malaria screen preliminary neg. TMax 101.2 this am Wbc 11.3 (admission 14.6)  Request for bone marrow biopsy Plan for Monday in Radiology    Past Medical History:  Diagnosis Date  . Chronic lower back pain   . FUO (fever of unknown origin) 01/30/2017    Past Surgical History:  Procedure Laterality Date  . NO PAST SURGERIES      Allergies: Patient has no known allergies.  Medications: Prior to Admission medications   Medication Sig Start Date End Date Taking? Authorizing Provider  ibuprofen (ADVIL,MOTRIN) 200 MG tablet Take 200 mg by mouth every 6 (six) hours as needed for moderate pain.   Yes [provider]     Family History  Problem Relation Age of Onset  . Stroke Neg Hx   . Heart disease Neg Hx   . Diabetes Neg Hx     Social History   Social History  . Marital status: Single    Spouse name: N/A  . Number of children: N/A  . Years of education: N/A   Social History Main Topics  . Smoking status: Never Smoker  . Smokeless tobacco: Never Used  . Alcohol use 1.8 oz/week    3 Glasses of wine per week  . Drug use: No  . Sexual activity: No   Other Topics Concern  . None   Social History Narrative  . None    Review of Systems: A 12 point ROS discussed and pertinent positives are indicated in the HPI above.  All other systems are negative.  Review of Systems  Constitutional: Positive for activity change, fatigue and fever.  Negative for appetite change.  Respiratory: Negative for cough and shortness of breath.   Cardiovascular: Positive for chest pain.  Gastrointestinal: Negative for abdominal pain.  Musculoskeletal: Positive for back pain. Negative for gait problem, neck pain and neck stiffness.  Neurological: Negative for weakness.  Psychiatric/Behavioral: Negative for behavioral problems and confusion.    Vital Signs: BP 106/62 (BP Location: Left Arm)   Pulse 98   Temp 98.4 F (36.9 C) Comment: re-evaluation  Resp 16   Ht 5' 3"  (1.6 m)   Wt 169 lb 6.4 oz (76.8 kg) Comment: pt still has on sandals and pants  SpO2 98%   BMI 30.01 kg/m   Physical Exam  Constitutional: He is oriented to person, place, and time.  Cardiovascular: Normal rate and regular rhythm.   Pulmonary/Chest: Effort normal and breath sounds normal.  Abdominal: Soft. Bowel sounds are normal.  Musculoskeletal: Normal range of motion.  Neurological: He is alert and oriented to person, place, and time.  Skin: Skin is warm and dry.  Skin rash over body Excoriations from scratching  Psychiatric: He has a normal mood and affect. His behavior is normal. Judgment and thought content normal.  Consented with [place] Interpreter on computer Nettie note and vitals reviewed.   Mallampati Score:  MD Evaluation Airway: WNL Heart: WNL  Abdomen: WNL Chest/ Lungs: WNL ASA  Classification: 3 Mallampati/Airway Score: One  Imaging: Dg Chest 2 View  Result Date: 01/30/2017 CLINICAL DATA:  Body aches, rash and fevers, 3 weeks duration. EXAM: CHEST  2 VIEW COMPARISON:  None. FINDINGS: Heart size is normal. Mediastinal shadows are normal. Mild left hilar prominence likely due to pulmonary vessels. The lungs are clear. No effusions. No bone abnormality. IMPRESSION: Normal chest Electronically Signed   By: Nelson Chimes M.D.   On: 01/30/2017 10:01   Ct Chest W Contrast  Result Date: 02/01/2017 CLINICAL DATA:  26 year old male  with fever of unknown origin. EXAM: CT CHEST, ABDOMEN, AND PELVIS WITH CONTRAST TECHNIQUE: Multidetector CT imaging of the chest, abdomen and pelvis was performed following the standard protocol during bolus administration of intravenous contrast. CONTRAST:  144m ISOVUE-300 IOPAMIDOL (ISOVUE-300) INJECTION 61% COMPARISON:  Chest radiograph dated 01/30/2017 FINDINGS: CT CHEST FINDINGS Cardiovascular: There is no cardiomegaly or pericardial effusion. The thoracic aorta is unremarkable. The origins of the great vessels of the aortic arch are patent. The central pulmonary arteries appear unremarkable. Mediastinum/Nodes: There is no hilar or mediastinal adenopathy. The esophagus and the thyroid gland are grossly unremarkable. No mediastinal fluid collection or hematoma. Lungs/Pleura: There is a 5 mm left upper lobe nodule (series 7, image 65). There is no focal consolidation, pleural effusion, or pneumothorax. The central airways are patent. Musculoskeletal: Multiple top-normal axillary lymph nodes which demonstrate fatty hilum, likely reactive. Clinical correlation is recommended. There is thickening of the skin in the axilla. No fluid collection. Multiple small nodular density in the subcutaneous soft tissues of the left anterior chest wall (series 3, image 26) of indeterminate etiology, likely normal appearing lymph nodes. Nonenlarged left supraclavicular lymph node measuring 7 mm in short axis. The chest wall soft tissues are otherwise unremarkable. The osseous structures are intact. CT ABDOMEN PELVIS FINDINGS Evaluation of this exam is limited due to respiratory motion artifact. There is no intra-abdominal free air or free fluid. Hepatobiliary: Probable mild fatty infiltration of the liver. No intrahepatic biliary ductal dilatation. The gallbladder is unremarkable. Pancreas: Unremarkable. No pancreatic ductal dilatation or surrounding inflammatory changes. Spleen: Normal in size without focal abnormality.  Adrenals/Urinary Tract: Adrenal glands are unremarkable. Kidneys are normal, without renal calculi, focal lesion, or hydronephrosis. Bladder is unremarkable. Stomach/Bowel: There is loose stool within the colon compatible with diarrheal state. Correlation with clinical exam and stool cultures recommended. Multiple normal caliber fluid-filled loops of small bowel throughout the abdomen noted which may be physiologic although enteritis is not excluded. Clinical correlation is recommended. There is no bowel obstruction. Normal appendix. Vascular/Lymphatic: No significant vascular findings are present. No enlarged abdominal or pelvic lymph nodes. Reproductive: The prostate and seminal vesicles are grossly unremarkable. Other: There is diastases of anterior abdominal wall musculature with a broad-based fat containing umbilical hernia. No fluid collection. Musculoskeletal: No acute or significant osseous findings. IMPRESSION: 1. A 5 mm left upper lobe nodule. No follow-up needed if patient is low-risk. Non-contrast chest CT can be considered in 12 months if patient is high-risk. This recommendation follows the consensus statement: Guidelines for Management of Incidental Pulmonary Nodules Detected on CT Images: From the Fleischner Society 2017; Radiology 2017; 284:228-243. 2. Top-normal bilateral axillary lymph nodes which demonstrate normal fatty hilum, likely reactive. Clinical correlation is recommended. 3. Fatty liver. 4. Fluid field loops of normal caliber small bowel may be physiologic or represent enteritis. Clinical correlation is recommended. 5. Diarrheal state. Correlation with clinical exam and stool cultures recommended. No bowel  obstruction. Normal appendix. Electronically Signed   By: Anner Crete M.D.   On: 02/01/2017 06:35   Ct Abdomen Pelvis W Contrast  Result Date: 02/01/2017 CLINICAL DATA:  26 year old male with fever of unknown origin. EXAM: CT CHEST, ABDOMEN, AND PELVIS WITH CONTRAST TECHNIQUE:  Multidetector CT imaging of the chest, abdomen and pelvis was performed following the standard protocol during bolus administration of intravenous contrast. CONTRAST:  172m ISOVUE-300 IOPAMIDOL (ISOVUE-300) INJECTION 61% COMPARISON:  Chest radiograph dated 01/30/2017 FINDINGS: CT CHEST FINDINGS Cardiovascular: There is no cardiomegaly or pericardial effusion. The thoracic aorta is unremarkable. The origins of the great vessels of the aortic arch are patent. The central pulmonary arteries appear unremarkable. Mediastinum/Nodes: There is no hilar or mediastinal adenopathy. The esophagus and the thyroid gland are grossly unremarkable. No mediastinal fluid collection or hematoma. Lungs/Pleura: There is a 5 mm left upper lobe nodule (series 7, image 65). There is no focal consolidation, pleural effusion, or pneumothorax. The central airways are patent. Musculoskeletal: Multiple top-normal axillary lymph nodes which demonstrate fatty hilum, likely reactive. Clinical correlation is recommended. There is thickening of the skin in the axilla. No fluid collection. Multiple small nodular density in the subcutaneous soft tissues of the left anterior chest wall (series 3, image 26) of indeterminate etiology, likely normal appearing lymph nodes. Nonenlarged left supraclavicular lymph node measuring 7 mm in short axis. The chest wall soft tissues are otherwise unremarkable. The osseous structures are intact. CT ABDOMEN PELVIS FINDINGS Evaluation of this exam is limited due to respiratory motion artifact. There is no intra-abdominal free air or free fluid. Hepatobiliary: Probable mild fatty infiltration of the liver. No intrahepatic biliary ductal dilatation. The gallbladder is unremarkable. Pancreas: Unremarkable. No pancreatic ductal dilatation or surrounding inflammatory changes. Spleen: Normal in size without focal abnormality. Adrenals/Urinary Tract: Adrenal glands are unremarkable. Kidneys are normal, without renal calculi,  focal lesion, or hydronephrosis. Bladder is unremarkable. Stomach/Bowel: There is loose stool within the colon compatible with diarrheal state. Correlation with clinical exam and stool cultures recommended. Multiple normal caliber fluid-filled loops of small bowel throughout the abdomen noted which may be physiologic although enteritis is not excluded. Clinical correlation is recommended. There is no bowel obstruction. Normal appendix. Vascular/Lymphatic: No significant vascular findings are present. No enlarged abdominal or pelvic lymph nodes. Reproductive: The prostate and seminal vesicles are grossly unremarkable. Other: There is diastases of anterior abdominal wall musculature with a broad-based fat containing umbilical hernia. No fluid collection. Musculoskeletal: No acute or significant osseous findings. IMPRESSION: 1. A 5 mm left upper lobe nodule. No follow-up needed if patient is low-risk. Non-contrast chest CT can be considered in 12 months if patient is high-risk. This recommendation follows the consensus statement: Guidelines for Management of Incidental Pulmonary Nodules Detected on CT Images: From the Fleischner Society 2017; Radiology 2017; 284:228-243. 2. Top-normal bilateral axillary lymph nodes which demonstrate normal fatty hilum, likely reactive. Clinical correlation is recommended. 3. Fatty liver. 4. Fluid field loops of normal caliber small bowel may be physiologic or represent enteritis. Clinical correlation is recommended. 5. Diarrheal state. Correlation with clinical exam and stool cultures recommended. No bowel obstruction. Normal appendix. Electronically Signed   By: AAnner CreteM.D.   On: 02/01/2017 06:35    Labs:  CBC:  Recent Labs  01/30/17 0925 01/31/17 0534 02/01/17 0435  WBC 14.6* 11.9* 11.3*  HGB 11.2* 9.5* 9.2*  HCT 34.7* 29.6* 28.5*  PLT 638* 611* 629*    COAGS: No results for input(s): INR, APTT in the last 8760  hours.  BMP:  Recent Labs   01/30/17 0925 01/31/17 0534 02/01/17 0435  NA 128* 134* 138  K 3.3* 3.9 3.9  CL 95* 101 108  CO2 19* 24 22  GLUCOSE 99 93 92  BUN 20 16 8   CALCIUM 9.0 8.5* 8.6*  CREATININE 1.33* 1.18 1.05  GFRNONAA >60 >60 >60  GFRAA >60 >60 >60    LIVER FUNCTION TESTS:  Recent Labs  01/30/17 0925 01/31/17 0534 02/01/17 0435  BILITOT 1.4* 0.8 0.5  AST 95* 86* 90*  ALT 32 28 27  ALKPHOS 89 72 68  PROT 8.0 6.9 6.6  ALBUMIN 2.9* 2.3* 2.4*    TUMOR MARKERS: No results for input(s): AFPTM, CEA, CA199, CHROMGRNA in the last 8760 hours.  Assessment and Plan:  FUO Skin rash and itching Finger and knee jt swelling and pain Scheduled for bone marrow biopsy 7/23 am in Rad Risks and Benefits discussed with the patient including, but not limited to bleeding, infection, damage to adjacent structures or low yield requiring additional tests. All of the patient's questions were answered, patient is agreeable to proceed. Consent signed and in chart.  Thank you for this interesting consult.  I greatly enjoyed meeting Carlos Holder and look forward to participating in their care.  A copy of this report was sent to the requesting provider on this date.  Electronically Signed: Lavonia Drafts, PA-C 02/01/2017, 4:03 PM   I spent a total of 20 Minutes    in face to face in clinical consultation, greater than 50% of which was counseling/coordinating care for Bone Marrow biopsy

## 2017-02-01 NOTE — Progress Notes (Signed)
Used stratus video interpretor to assess pt, pt denied pain level and pt stated he did not have any questions at this moment.   Pt denies any travel out of the country    Will continue to monitor,   Leonia ReevesNatalie N Ivanna Kocak, RN

## 2017-02-01 NOTE — Progress Notes (Signed)
Temperature decrease from 101.5 to 98.4  upon re-evaluation post 400mg  Ibuprofen administration per MD order at 10:51

## 2017-02-01 NOTE — Progress Notes (Signed)
Initial Nutrition Assessment  DOCUMENTATION CODES:   Not applicable  INTERVENTION:   -Continue Ensure Enlive po BID, each supplement provides 350 kcal and 20 grams of protein   NUTRITION DIAGNOSIS:   Increased nutrient needs related to acute illness as evidenced by estimated needs.  GOAL:   Patient will meet greater than or equal to 90% of their needs  MONITOR:   PO intake, Supplement acceptance, Labs, Weight trends  REASON FOR ASSESSMENT:   Malnutrition Screening Tool    ASSESSMENT:   26 yo male admitted with FUO, atypical chest pain, rash, AKI with hyponatremia.  No previous medical history.    Recorded po intake 50-100% of meals.   Pt verbalizes wt loss but unsure of much. No previous weight encounters in the computer.   Nutrition-Focused physical exam completed. Findings are WDL for fat depletion, muscle depletion, and edema.   Labs: reviewed Meds: NS at 125 ml/hr  Diet Order:  Diet regular Room service appropriate? Yes; Fluid consistency: Thin  Skin:  Reviewed, no issues  Last BM:  7/19  Height:   Ht Readings from Last 1 Encounters:  01/30/17 5\' 3"  (1.6 m)    Weight:   Wt Readings from Last 1 Encounters:  01/31/17 169 lb 6.4 oz (76.8 kg)    Ideal Body Weight:     BMI:  Body mass index is 30.01 kg/m.  Estimated Nutritional Needs:   Kcal:  2160-2420 kcals  Protein:  100-110 g  Fluid:  >/= 2.1 L  EDUCATION NEEDS:   No education needs identified at this time  Romelle StarcherCate Saja Bartolini MS, RD, LDN [phone] Pager  [phone] Weekend/On-Call Pager

## 2017-02-02 LAB — CBC WITH DIFFERENTIAL/PLATELET
Basophils Absolute: 0.1 10*3/uL (ref 0.0–0.1)
Basophils Relative: 1 %
EOS ABS: 0 10*3/uL (ref 0.0–0.7)
EOS PCT: 0 %
HCT: 28.2 % — ABNORMAL LOW (ref 39.0–52.0)
Hemoglobin: 9.2 g/dL — ABNORMAL LOW (ref 13.0–17.0)
LYMPHS PCT: 15 %
Lymphs Abs: 1.9 10*3/uL (ref 0.7–4.0)
MCH: 28.8 pg (ref 26.0–34.0)
MCHC: 32.6 g/dL (ref 30.0–36.0)
MCV: 88.1 fL (ref 78.0–100.0)
MONO ABS: 0.4 10*3/uL (ref 0.1–1.0)
Monocytes Relative: 3 %
NEUTROS PCT: 81 %
Neutro Abs: 10.2 10*3/uL — ABNORMAL HIGH (ref 1.7–7.7)
PLATELETS: 663 10*3/uL — AB (ref 150–400)
RBC: 3.2 MIL/uL — AB (ref 4.22–5.81)
RDW: 13.4 % (ref 11.5–15.5)
WBC: 12.6 10*3/uL — AB (ref 4.0–10.5)

## 2017-02-02 LAB — RETICULOCYTES
RBC.: 3.2 MIL/uL — AB (ref 4.22–5.81)
RETIC COUNT ABSOLUTE: 35.2 10*3/uL (ref 19.0–186.0)
RETIC CT PCT: 1.1 % (ref 0.4–3.1)

## 2017-02-02 LAB — FERRITIN: Ferritin: >7500 ng/mL — ABNORMAL HIGH (ref 24–336)

## 2017-02-02 LAB — LACTATE DEHYDROGENASE: LDH: 1401 U/L — ABNORMAL HIGH (ref 98–192)

## 2017-02-02 NOTE — Progress Notes (Signed)
PROGRESS NOTE    Carlos Holder  LYY:[id number] DOB: 1991/04/14 DOA: 01/30/2017 PCP: Patient, No Pcp Per    Brief Narrative:  26 y.o. male with no significant past medical history. Patient is an immigrant to the [place] from the [place] 2 years ago. Patient states he is not up-to-date on any of his immunizations. Patient was in a home with 3 other individuals from [place]. All others in the home are healthy but is unsure of their immunization status. No individuals in the home have traveled outside the country in the last several months.  Patient was in his normal state of health until approximately 3 weeks prior to admission when he developed intermittent knee pain. Seemed to have a waxing and waning nature but was worse with ambulation. This would occur approximately 3-4 days out of the week. Patient unable to identify any trigger or alleviating factor associated with the pain other than rest. (Level V caveat applies as patient encounter was dictated by [place] interpreter with some difficulty. For example through the interpreter pt stated he had foot pain but was clearly pointing to his knee.). Patient states he has also felt warm over this period of time and has also felt itchy. Patient does endorse chest pain which seems to be described as a deep feeling of pain in his chest. This pain is nonexertional and is not associated with shortness of breath, diaphoresis, nausea or radiation to the neck arm or back. This pain is constant and mild. Rash on upper chest started approximately 1 week ago gradually across upper chest, arms and upper back. Patient denies ever being sexually active. Denies nausea, vomiting, abdominal pain, dysuria, frequency, penile discharge, groin adenopathy, neck stiffness, headache, LOC, focal neurological deficit. Patient does endorse approximately 2 week history of bilateral middle finger swelling and pain. Constant.  Assessment & Plan:   Active Problems:  FUO (fever of unknown origin)   Atypical chest pain   Rash   AKI (acute kidney injury) ([facility])   Hyponatremia   Normochromic anemia   Elevated serum lactate dehydrogenase (LDH)   Lymphadenopathy, axillary  FUO:  - Patient with presenting rash on arms and chest/back, B knee pain w/o swelling, B middle finger swelling and pain, and fever for 3 weeks, presenting WBC 14.6. -ID following. Discussed case with Infectious Disease. Recommendation for CT chest, abd/pelvis with contrast to r/o underlying pathology -Labs reviewed. WBC improved. Continues to be febrile -Thus far, blood cx neg, hepatitis, HIV neg. Per ID, malaria screen preliminary neg. -Continues to be febrile. Have added ibuprofen PRN fevers -Had discussed with ID, recommendations for chest, abd/pelvis CT. Reviewed. No significant findings noted. -Appreciate input by Hematology. Plans for bone marrow biopsy on Monday  Chest pain:  -Patient is continued on tele -Currently stable at this time  Rash:  - Likely due to cause of FUO.  - Patient continued on hydrocortisone 1% cream for itch relief  AKI: Presenting Cr 1.33. Likely from dehydration from very poor oral intake for several days. - Renal function is improving with IVF -Cont IVF as patient remains febrile, concerns for insensible volume loss and return of renal failure if IVF stopped at this time  Hyponatremia: 128. Likely from poor oral intake for several days w/ limited free water intake.  - normalized - recheck bmet in AM  DVT prophylaxis:  Lovenox subQ Code Status: Full Family Communication: Pt in room, family not at bedside, discussed with patient's father on 7/20 at bedside Disposition Plan: Uncertain  at this time  Consultants:   ID  Hematology  Procedures:     Antimicrobials: Anti-infectives    None      Subjective: No complaints at this time  Objective: Vitals:   02/01/17 2140 02/01/17 2355 02/02/17 0414 02/02/17 0925  BP:   122/62 (!)  121/52  Pulse:   91 (!) 105  Resp:   18 18  Temp: (!) 102.6 F (39.2 C) 99.4 F (37.4 C) 99 F (37.2 C) 99.6 F (37.6 C)  TempSrc: Oral Oral Oral Oral  SpO2:   100% 98%  Weight:      Height:        Intake/Output Summary (Last 24 hours) at 02/02/17 1526 Last data filed at 02/02/17 0925  Gross per 24 hour  Intake          2945.42 ml  Output              925 ml  Net          2020.42 ml   Filed Weights   01/30/17 2313 01/31/17 2009 02/01/17 2036  Weight: 77.1 kg (170 lb) 76.8 kg (169 lb 6.4 oz) 77.7 kg (171 lb 3.2 oz)    Examination: General exam: Conversant, in no acute distress Respiratory system: normal chest rise, clear, no audible wheezing Cardiovascular system: regular rhythm, s1-s2 Gastrointestinal system: Nondistended, nontender, pos BS Central nervous system: No seizures, no tremors Extremities: No cyanosis, no joint deformities Skin: No rashes, no pallor Psychiatry: Affect normal // no auditory hallucinations    Data Reviewed: I have personally reviewed following labs and imaging studies  CBC:  Recent Labs Lab 01/30/17 0925 01/31/17 0534 02/01/17 0435 02/02/17 0429  WBC 14.6* 11.9* 11.3* 12.6*  NEUTROABS 12.3*  --  6.4 10.2*  HGB 11.2* 9.5* 9.2* 9.2*  HCT 34.7* 29.6* 28.5* 28.2*  MCV 85.5 87.6 88.8 88.1  PLT 638* 611* 629* 833*   Basic Metabolic Panel:  Recent Labs Lab 01/30/17 0925 01/31/17 0534 02/01/17 0435  NA 128* 134* 138  K 3.3* 3.9 3.9  CL 95* 101 108  CO2 19* 24 22  GLUCOSE 99 93 92  BUN _0 CREATININE 1.33* 1.18 1.05  CALCIUM 9.0 8.5* 8.6*   GFR: Estimated Creatinine Clearance: 97.5 mL/min (by C-G formula based on SCr of 1.05 mg/dL). Liver Function Tests:  Recent Labs Lab 01/30/17 0925 01/31/17 0534 02/01/17 0435  AST 95* 86* 90*  ALT 32 28 27  ALKPHOS 89 72 68  BILITOT 1.4* 0.8 0.5  PROT 8.0 6.9 6.6  ALBUMIN 2.9* 2.3* 2.4*   No results for input(s): LIPASE, AMYLASE in the last 168 hours. No results for  input(s): AMMONIA in the last 168 hours. Coagulation Profile: No results for input(s): INR, PROTIME in the last 168 hours. Cardiac Enzymes:  Recent Labs Lab 01/30/17 0925 01/30/17 1135 01/30/17 1907 01/30/17 2313  CKTOTAL 254  --   --   --   TROPONINI  --  <0.03 <0.03 <0.03   BNP (last 3 results) No results for input(s): PROBNP in the last 8760 hours. HbA1C: No results for input(s): HGBA1C in the last 72 hours. CBG: No results for input(s): GLUCAP in the last 168 hours. Lipid Profile: No results for input(s): CHOL, HDL, LDLCALC, TRIG, CHOLHDL, LDLDIRECT in the last 72 hours. Thyroid Function Tests: No results for input(s): TSH, T4TOTAL, FREET4, T3FREE, THYROIDAB in the last 72 hours. Anemia Panel:  Recent Labs  02/01/17 1447 02/02/17 0429  FERRITIN >7,500* >7,500*  RETICCTPCT  --  1.1   Sepsis Labs:  Recent Labs Lab 01/30/17 0932  LATICACIDVEN 1.87    Recent Results (from the past 240 hour(s))  Blood culture (routine x 2)     Status: None (Preliminary result)   Collection Time: 01/30/17  9:20 AM  Result Value Ref Range Status   Specimen Description BLOOD LEFT ANTECUBITAL  Final   Special Requests   Final    BOTTLES DRAWN AEROBIC AND ANAEROBIC Blood Culture adequate volume   Culture NO GROWTH 2 DAYS  Final   Report Status PENDING  Incomplete  Blood culture (routine x 2)     Status: None (Preliminary result)   Collection Time: 01/30/17  9:25 AM  Result Value Ref Range Status   Specimen Description BLOOD LEFT HAND  Final   Special Requests   Final    BOTTLES DRAWN AEROBIC AND ANAEROBIC Blood Culture adequate volume   Culture NO GROWTH 2 DAYS  Final   Report Status PENDING  Incomplete  Respiratory Panel by PCR     Status: None   Collection Time: 01/30/17  7:51 PM  Result Value Ref Range Status   Adenovirus NOT DETECTED NOT DETECTED Final   Coronavirus 229E NOT DETECTED NOT DETECTED Final   Coronavirus HKU1 NOT DETECTED NOT DETECTED Final   Coronavirus NL63  NOT DETECTED NOT DETECTED Final   Coronavirus OC43 NOT DETECTED NOT DETECTED Final   Metapneumovirus NOT DETECTED NOT DETECTED Final   Rhinovirus / Enterovirus NOT DETECTED NOT DETECTED Final   Influenza A NOT DETECTED NOT DETECTED Final   Influenza B NOT DETECTED NOT DETECTED Final   Parainfluenza Virus 1 NOT DETECTED NOT DETECTED Final   Parainfluenza Virus 2 NOT DETECTED NOT DETECTED Final   Parainfluenza Virus 3 NOT DETECTED NOT DETECTED Final   Parainfluenza Virus 4 NOT DETECTED NOT DETECTED Final   Respiratory Syncytial Virus NOT DETECTED NOT DETECTED Final   Bordetella pertussis NOT DETECTED NOT DETECTED Final   Chlamydophila pneumoniae NOT DETECTED NOT DETECTED Final   Mycoplasma pneumoniae NOT DETECTED NOT DETECTED Final     Radiology Studies: Ct Chest W Contrast  Result Date: 02/01/2017 CLINICAL DATA:  26 year old male with fever of unknown origin. EXAM: CT CHEST, ABDOMEN, AND PELVIS WITH CONTRAST TECHNIQUE: Multidetector CT imaging of the chest, abdomen and pelvis was performed following the standard protocol during bolus administration of intravenous contrast. CONTRAST:  111m ISOVUE-300 IOPAMIDOL (ISOVUE-300) INJECTION 61% COMPARISON:  Chest radiograph dated 01/30/2017 FINDINGS: CT CHEST FINDINGS Cardiovascular: There is no cardiomegaly or pericardial effusion. The thoracic aorta is unremarkable. The origins of the great vessels of the aortic arch are patent. The central pulmonary arteries appear unremarkable. Mediastinum/Nodes: There is no hilar or mediastinal adenopathy. The esophagus and the thyroid gland are grossly unremarkable. No mediastinal fluid collection or hematoma. Lungs/Pleura: There is a 5 mm left upper lobe nodule (series 7, image 65). There is no focal consolidation, pleural effusion, or pneumothorax. The central airways are patent. Musculoskeletal: Multiple top-normal axillary lymph nodes which demonstrate fatty hilum, likely reactive. Clinical correlation is  recommended. There is thickening of the skin in the axilla. No fluid collection. Multiple small nodular density in the subcutaneous soft tissues of the left anterior chest wall (series 3, image 26) of indeterminate etiology, likely normal appearing lymph nodes. Nonenlarged left supraclavicular lymph node measuring 7 mm in short axis. The chest wall soft tissues are otherwise unremarkable. The osseous structures are intact. CT ABDOMEN PELVIS FINDINGS Evaluation of this exam is limited due  to respiratory motion artifact. There is no intra-abdominal free air or free fluid. Hepatobiliary: Probable mild fatty infiltration of the liver. No intrahepatic biliary ductal dilatation. The gallbladder is unremarkable. Pancreas: Unremarkable. No pancreatic ductal dilatation or surrounding inflammatory changes. Spleen: Normal in size without focal abnormality. Adrenals/Urinary Tract: Adrenal glands are unremarkable. Kidneys are normal, without renal calculi, focal lesion, or hydronephrosis. Bladder is unremarkable. Stomach/Bowel: There is loose stool within the colon compatible with diarrheal state. Correlation with clinical exam and stool cultures recommended. Multiple normal caliber fluid-filled loops of small bowel throughout the abdomen noted which may be physiologic although enteritis is not excluded. Clinical correlation is recommended. There is no bowel obstruction. Normal appendix. Vascular/Lymphatic: No significant vascular findings are present. No enlarged abdominal or pelvic lymph nodes. Reproductive: The prostate and seminal vesicles are grossly unremarkable. Other: There is diastases of anterior abdominal wall musculature with a broad-based fat containing umbilical hernia. No fluid collection. Musculoskeletal: No acute or significant osseous findings. IMPRESSION: 1. A 5 mm left upper lobe nodule. No follow-up needed if patient is low-risk. Non-contrast chest CT can be considered in 12 months if patient is high-risk.  This recommendation follows the consensus statement: Guidelines for Management of Incidental Pulmonary Nodules Detected on CT Images: From the Fleischner Society 2017; Radiology 2017; 284:228-243. 2. Top-normal bilateral axillary lymph nodes which demonstrate normal fatty hilum, likely reactive. Clinical correlation is recommended. 3. Fatty liver. 4. Fluid field loops of normal caliber small bowel may be physiologic or represent enteritis. Clinical correlation is recommended. 5. Diarrheal state. Correlation with clinical exam and stool cultures recommended. No bowel obstruction. Normal appendix. Electronically Signed   By: Anner Crete M.D.   On: 02/01/2017 06:35   Ct Abdomen Pelvis W Contrast  Result Date: 02/01/2017 CLINICAL DATA:  26 year old male with fever of unknown origin. EXAM: CT CHEST, ABDOMEN, AND PELVIS WITH CONTRAST TECHNIQUE: Multidetector CT imaging of the chest, abdomen and pelvis was performed following the standard protocol during bolus administration of intravenous contrast. CONTRAST:  125m ISOVUE-300 IOPAMIDOL (ISOVUE-300) INJECTION 61% COMPARISON:  Chest radiograph dated 01/30/2017 FINDINGS: CT CHEST FINDINGS Cardiovascular: There is no cardiomegaly or pericardial effusion. The thoracic aorta is unremarkable. The origins of the great vessels of the aortic arch are patent. The central pulmonary arteries appear unremarkable. Mediastinum/Nodes: There is no hilar or mediastinal adenopathy. The esophagus and the thyroid gland are grossly unremarkable. No mediastinal fluid collection or hematoma. Lungs/Pleura: There is a 5 mm left upper lobe nodule (series 7, image 65). There is no focal consolidation, pleural effusion, or pneumothorax. The central airways are patent. Musculoskeletal: Multiple top-normal axillary lymph nodes which demonstrate fatty hilum, likely reactive. Clinical correlation is recommended. There is thickening of the skin in the axilla. No fluid collection. Multiple small  nodular density in the subcutaneous soft tissues of the left anterior chest wall (series 3, image 26) of indeterminate etiology, likely normal appearing lymph nodes. Nonenlarged left supraclavicular lymph node measuring 7 mm in short axis. The chest wall soft tissues are otherwise unremarkable. The osseous structures are intact. CT ABDOMEN PELVIS FINDINGS Evaluation of this exam is limited due to respiratory motion artifact. There is no intra-abdominal free air or free fluid. Hepatobiliary: Probable mild fatty infiltration of the liver. No intrahepatic biliary ductal dilatation. The gallbladder is unremarkable. Pancreas: Unremarkable. No pancreatic ductal dilatation or surrounding inflammatory changes. Spleen: Normal in size without focal abnormality. Adrenals/Urinary Tract: Adrenal glands are unremarkable. Kidneys are normal, without renal calculi, focal lesion, or hydronephrosis. Bladder is unremarkable. Stomach/Bowel:  There is loose stool within the colon compatible with diarrheal state. Correlation with clinical exam and stool cultures recommended. Multiple normal caliber fluid-filled loops of small bowel throughout the abdomen noted which may be physiologic although enteritis is not excluded. Clinical correlation is recommended. There is no bowel obstruction. Normal appendix. Vascular/Lymphatic: No significant vascular findings are present. No enlarged abdominal or pelvic lymph nodes. Reproductive: The prostate and seminal vesicles are grossly unremarkable. Other: There is diastases of anterior abdominal wall musculature with a broad-based fat containing umbilical hernia. No fluid collection. Musculoskeletal: No acute or significant osseous findings. IMPRESSION: 1. A 5 mm left upper lobe nodule. No follow-up needed if patient is low-risk. Non-contrast chest CT can be considered in 12 months if patient is high-risk. This recommendation follows the consensus statement: Guidelines for Management of Incidental  Pulmonary Nodules Detected on CT Images: From the Fleischner Society 2017; Radiology 2017; 284:228-243. 2. Top-normal bilateral axillary lymph nodes which demonstrate normal fatty hilum, likely reactive. Clinical correlation is recommended. 3. Fatty liver. 4. Fluid field loops of normal caliber small bowel may be physiologic or represent enteritis. Clinical correlation is recommended. 5. Diarrheal state. Correlation with clinical exam and stool cultures recommended. No bowel obstruction. Normal appendix. Electronically Signed   By: Anner Crete M.D.   On: 02/01/2017 06:35    Scheduled Meds: . enoxaparin (LOVENOX) injection  40 mg Subcutaneous Q24H  . [START ON 02/04/2017] enoxaparin (LOVENOX) injection  40 mg Subcutaneous Q24H  . feeding supplement (ENSURE ENLIVE)  237 mL Oral BID BM  . hydrocortisone cream   Topical BID   Continuous Infusions: . sodium chloride 125 mL/hr at 02/02/17 0712     LOS: 3 days   Lalonnie Shaffer, Orpah Melter, MD [facility] Pager [phone]  If 7PM-7AM, please contact night-coverage [email or web address] Password [facility] 02/02/2017, 3:26 PM

## 2017-02-03 DIAGNOSIS — R590 Localized enlarged lymph nodes: Secondary | ICD-10-CM

## 2017-02-03 DIAGNOSIS — D649 Anemia, unspecified: Secondary | ICD-10-CM

## 2017-02-03 LAB — HIV-1 RNA ULTRAQUANT REFLEX TO GENTYP+: HIV-1 RNA Quant, Log: UNDETERMINED log10copy/mL

## 2017-02-03 NOTE — Progress Notes (Signed)
PROGRESS NOTE    Carlos Holder  QQP:[id number] DOB: August 05, 1990 DOA: 01/30/2017 PCP: Patient, No Pcp Per    Brief Narrative:  26 y.o. male with no significant past medical history. Patient is an immigrant to the [place] from the [place] 2 years ago. Patient states he is not up-to-date on any of his immunizations. Patient was in a home with 3 other individuals from [place]. All others in the home are healthy but is unsure of their immunization status. No individuals in the home have traveled outside the country in the last several months.  Patient was in his normal state of health until approximately 3 weeks prior to admission when he developed intermittent knee pain. Seemed to have a waxing and waning nature but was worse with ambulation. This would occur approximately 3-4 days out of the week. Patient unable to identify any trigger or alleviating factor associated with the pain other than rest. (Level V caveat applies as patient encounter was dictated by [place] interpreter with some difficulty. For example through the interpreter pt stated he had foot pain but was clearly pointing to his knee.). Patient states he has also felt warm over this period of time and has also felt itchy. Patient does endorse chest pain which seems to be described as a deep feeling of pain in his chest. This pain is nonexertional and is not associated with shortness of breath, diaphoresis, nausea or radiation to the neck arm or back. This pain is constant and mild. Rash on upper chest started approximately 1 week ago gradually across upper chest, arms and upper back. Patient denies ever being sexually active. Denies nausea, vomiting, abdominal pain, dysuria, frequency, penile discharge, groin adenopathy, neck stiffness, headache, LOC, focal neurological deficit. Patient does endorse approximately 2 week history of bilateral middle finger swelling and pain. Constant.  Assessment & Plan:   Active Problems:  FUO (fever of unknown origin)   Atypical chest pain   Rash   AKI (acute kidney injury) ([place])   Hyponatremia   Normochromic anemia   Elevated serum lactate dehydrogenase (LDH)   Lymphadenopathy, axillary  FUO:  - Patient with presenting rash on arms and chest/back, B knee pain w/o swelling, B middle finger swelling and pain, and fever for 3 weeks, presenting WBC 14.6. -ID following. Discussed case with Infectious Disease. Recommendation for CT chest, abd/pelvis with contrast to r/o underlying pathology -Labs reviewed. WBC improved. Continues to be febrile -Thus far, blood cx neg, hepatitis, HIV neg. Per ID, malaria screen preliminary neg. -Continues to be febrile. Have added ibuprofen PRN fevers -Had discussed with ID, recommendations for chest, abd/pelvis CT. Reviewed. No significant findings noted. -Appreciate input by Hematology. Patient is to have bone marrow biopsy 7/23  Chest pain:  -Patient is continued on tele -Currently stable at this time  Rash:  - Likely due to cause of FUO.  - continue with topical steroid as needed  AKI: Presenting Cr 1.33. Likely from dehydration from very poor oral intake for several days. - Renal function is improving with IVF - Most recent Cr of 1.02  Hyponatremia: 128. Likely from poor oral intake for several days w/ limited free water intake.  - normalized - stable at present  DVT prophylaxis:  Lovenox subQ Code Status: Full Family Communication: pt in room Disposition Plan: Uncertain at this time  Consultants:   ID  Hematology  Procedures:     Antimicrobials: Anti-infectives    None      Subjective: Reports feeling better,  however still "bad."  Objective: Vitals:   02/02/17 1759 02/02/17 2100 02/03/17 0410 02/03/17 0916  BP: (!) 125/56 (!) 118/58 127/70 120/68  Pulse: (!) 106 92 88 85  Resp: 17  (!) 26 (!) 23  Temp: 99.8 F (37.7 C) 99.6 F (37.6 C) 99.5 F (37.5 C) 99.2 F (37.3 C)  TempSrc: Oral Oral Oral  Oral  SpO2: 98% 98% 97% 97%  Weight:      Height:        Intake/Output Summary (Last 24 hours) at 02/03/17 1442 Last data filed at 02/03/17 4098  Gross per 24 hour  Intake             1970 ml  Output              950 ml  Net             1020 ml   Filed Weights   01/30/17 2313 01/31/17 2009 02/01/17 2036  Weight: 77.1 kg (170 lb) 76.8 kg (169 lb 6.4 oz) 77.7 kg (171 lb 3.2 oz)    Examination: General exam: Awake, laying in bed, in nad Respiratory system: Normal respiratory effort, no wheezing Cardiovascular system: regular rate, s1, s2 Gastrointestinal system: Soft, nondistended, positive BS Central nervous system: CN2-12 grossly intact, strength intact Extremities: Perfused, no clubbing Skin: Normal skin turgor, no notable skin lesions seen Psychiatry: Mood normal // no visual hallucinations   Data Reviewed: I have personally reviewed following labs and imaging studies  CBC:  Recent Labs Lab 01/30/17 0925 01/31/17 0534 02/01/17 0435 02/02/17 0429  WBC 14.6* 11.9* 11.3* 12.6*  NEUTROABS 12.3*  --  6.4 10.2*  HGB 11.2* 9.5* 9.2* 9.2*  HCT 34.7* 29.6* 28.5* 28.2*  MCV 85.5 87.6 88.8 88.1  PLT 638* 611* 629* 119*   Basic Metabolic Panel:  Recent Labs Lab 01/30/17 0925 01/31/17 0534 02/01/17 0435  NA 128* 134* 138  K 3.3* 3.9 3.9  CL 95* 101 108  CO2 19* 24 22  GLUCOSE 99 93 92  BUN _0 CREATININE 1.33* 1.18 1.05  CALCIUM 9.0 8.5* 8.6*   GFR: Estimated Creatinine Clearance: 97.5 mL/min (by C-G formula based on SCr of 1.05 mg/dL). Liver Function Tests:  Recent Labs Lab 01/30/17 0925 01/31/17 0534 02/01/17 0435  AST 95* 86* 90*  ALT 32 28 27  ALKPHOS 89 72 68  BILITOT 1.4* 0.8 0.5  PROT 8.0 6.9 6.6  ALBUMIN 2.9* 2.3* 2.4*   No results for input(s): LIPASE, AMYLASE in the last 168 hours. No results for input(s): AMMONIA in the last 168 hours. Coagulation Profile: No results for input(s): INR, PROTIME in the last 168 hours. Cardiac  Enzymes:  Recent Labs Lab 01/30/17 0925 01/30/17 1135 01/30/17 1907 01/30/17 2313  CKTOTAL 254  --   --   --   TROPONINI  --  <0.03 <0.03 <0.03   BNP (last 3 results) No results for input(s): PROBNP in the last 8760 hours. HbA1C: No results for input(s): HGBA1C in the last 72 hours. CBG: No results for input(s): GLUCAP in the last 168 hours. Lipid Profile: No results for input(s): CHOL, HDL, LDLCALC, TRIG, CHOLHDL, LDLDIRECT in the last 72 hours. Thyroid Function Tests: No results for input(s): TSH, T4TOTAL, FREET4, T3FREE, THYROIDAB in the last 72 hours. Anemia Panel:  Recent Labs  02/01/17 1447 02/02/17 0429  FERRITIN >7,500* >7,500*  RETICCTPCT  --  1.1   Sepsis Labs:  Recent Labs Lab 01/30/17 0932  LATICACIDVEN 1.87  Recent Results (from the past 240 hour(s))  Blood culture (routine x 2)     Status: None (Preliminary result)   Collection Time: 01/30/17  9:20 AM  Result Value Ref Range Status   Specimen Description BLOOD LEFT ANTECUBITAL  Final   Special Requests   Final    BOTTLES DRAWN AEROBIC AND ANAEROBIC Blood Culture adequate volume   Culture NO GROWTH 4 DAYS  Final   Report Status PENDING  Incomplete  Blood culture (routine x 2)     Status: None (Preliminary result)   Collection Time: 01/30/17  9:25 AM  Result Value Ref Range Status   Specimen Description BLOOD LEFT HAND  Final   Special Requests   Final    BOTTLES DRAWN AEROBIC AND ANAEROBIC Blood Culture adequate volume   Culture NO GROWTH 4 DAYS  Final   Report Status PENDING  Incomplete  Respiratory Panel by PCR     Status: None   Collection Time: 01/30/17  7:51 PM  Result Value Ref Range Status   Adenovirus NOT DETECTED NOT DETECTED Final   Coronavirus 229E NOT DETECTED NOT DETECTED Final   Coronavirus HKU1 NOT DETECTED NOT DETECTED Final   Coronavirus NL63 NOT DETECTED NOT DETECTED Final   Coronavirus OC43 NOT DETECTED NOT DETECTED Final   Metapneumovirus NOT DETECTED NOT DETECTED Final    Rhinovirus / Enterovirus NOT DETECTED NOT DETECTED Final   Influenza A NOT DETECTED NOT DETECTED Final   Influenza B NOT DETECTED NOT DETECTED Final   Parainfluenza Virus 1 NOT DETECTED NOT DETECTED Final   Parainfluenza Virus 2 NOT DETECTED NOT DETECTED Final   Parainfluenza Virus 3 NOT DETECTED NOT DETECTED Final   Parainfluenza Virus 4 NOT DETECTED NOT DETECTED Final   Respiratory Syncytial Virus NOT DETECTED NOT DETECTED Final   Bordetella pertussis NOT DETECTED NOT DETECTED Final   Chlamydophila pneumoniae NOT DETECTED NOT DETECTED Final   Mycoplasma pneumoniae NOT DETECTED NOT DETECTED Final     Radiology Studies: No results found.  Scheduled Meds: . [START ON 02/04/2017] enoxaparin (LOVENOX) injection  40 mg Subcutaneous Q24H  . feeding supplement (ENSURE ENLIVE)  237 mL Oral BID BM  . hydrocortisone cream   Topical BID   Continuous Infusions: . sodium chloride 125 mL/hr at 02/03/17 0925     LOS: 4 days   Zaydon Kinser, Orpah Melter, MD [facility] Pager [phone]  If 7PM-7AM, please contact night-coverage [email or web address] Password [email or web address] 02/03/2017, 2:42 PM

## 2017-02-04 ENCOUNTER — Inpatient Hospital Stay (HOSPITAL_COMMUNITY): Payer: Self-pay

## 2017-02-04 DIAGNOSIS — L299 Pruritus, unspecified: Secondary | ICD-10-CM

## 2017-02-04 LAB — CBC WITH DIFFERENTIAL/PLATELET
BAND NEUTROPHILS: 19 %
BASOS ABS: 0 10*3/uL (ref 0.0–0.1)
BLASTS: 0 %
Basophils Relative: 0 %
EOS ABS: 2.3 10*3/uL — AB (ref 0.0–0.7)
Eosinophils Relative: 18 %
HCT: 26.4 % — ABNORMAL LOW (ref 39.0–52.0)
HEMOGLOBIN: 8.4 g/dL — AB (ref 13.0–17.0)
Lymphocytes Relative: 14 %
Lymphs Abs: 1.8 10*3/uL (ref 0.7–4.0)
MCH: 27.4 pg (ref 26.0–34.0)
MCHC: 31.8 g/dL (ref 30.0–36.0)
MCV: 86 fL (ref 78.0–100.0)
MYELOCYTES: 0 %
Metamyelocytes Relative: 0 %
Monocytes Absolute: 0.6 10*3/uL (ref 0.1–1.0)
Monocytes Relative: 5 %
Neutro Abs: 7.9 10*3/uL — ABNORMAL HIGH (ref 1.7–7.7)
Neutrophils Relative %: 44 %
Other: 0 %
PLATELETS: 788 10*3/uL — AB (ref 150–400)
PROMYELOCYTES ABS: 0 %
RBC: 3.07 MIL/uL — ABNORMAL LOW (ref 4.22–5.81)
RDW: 13.5 % (ref 11.5–15.5)
WBC Morphology: INCREASED
WBC: 12.6 10*3/uL — ABNORMAL HIGH (ref 4.0–10.5)
nRBC: 2 /100 WBC — ABNORMAL HIGH

## 2017-02-04 LAB — CULTURE, BLOOD (ROUTINE X 2)
CULTURE: NO GROWTH
CULTURE: NO GROWTH
SPECIAL REQUESTS: ADEQUATE
Special Requests: ADEQUATE

## 2017-02-04 LAB — PARASITE EXAM, BLOOD

## 2017-02-04 MED ORDER — MIDAZOLAM HCL 2 MG/2ML IJ SOLN
INTRAMUSCULAR | Status: AC | PRN
  Administered 2017-02-04: 0.5 mg via INTRAVENOUS
  Administered 2017-02-04: 1 mg via INTRAVENOUS
  Administered 2017-02-04 (×2): 0.5 mg via INTRAVENOUS

## 2017-02-04 MED ORDER — FENTANYL CITRATE (PF) 100 MCG/2ML IJ SOLN
INTRAMUSCULAR | Status: AC
  Filled 2017-02-04: qty 4

## 2017-02-04 MED ORDER — LIDOCAINE HCL (PF) 1 % IJ SOLN
INTRAMUSCULAR | Status: AC
  Filled 2017-02-04: qty 30

## 2017-02-04 MED ORDER — FENTANYL CITRATE (PF) 100 MCG/2ML IJ SOLN
INTRAMUSCULAR | Status: AC | PRN
  Administered 2017-02-04 (×3): 25 ug via INTRAVENOUS
  Administered 2017-02-04: 50 ug via INTRAVENOUS

## 2017-02-04 MED ORDER — MIDAZOLAM HCL 2 MG/2ML IJ SOLN
INTRAMUSCULAR | Status: AC
  Filled 2017-02-04: qty 4

## 2017-02-04 MED ORDER — LIDOCAINE-EPINEPHRINE 1 %-1:100000 IJ SOLN
INTRAMUSCULAR | Status: AC
  Filled 2017-02-04: qty 1

## 2017-02-04 MED ORDER — HYDROCORTISONE 1 % EX CREA: TOPICAL_CREAM | Freq: Two times a day (BID) | CUTANEOUS | 0 refills | Status: DC

## 2017-02-04 NOTE — Progress Notes (Signed)
[facility] for [facility]ious [facility]   Reason for visit: Follow up on fever  Interval History: bone marrow done today.  No new issues.  Has remained afebrile.   Physical Exam: Constitutional:  Vitals:   02/04/17 1035 02/04/17 1045  BP: 139/83 139/83  Pulse: 98 92  Resp: 16 16  Temp:     patient appears in NAD Respiratory: Normal respiratory effort; CTA B Cardiovascular: RRR GI: soft, nt, nd Skin: hyperpigmented rash  Review of Systems: Constitutional: negative for fevers and chills Gastrointestinal: negative for diarrhea Integument/breast: positive for rash and pruritus  Lab Results  Component Value Date   WBC 12.6 (H) 02/04/2017   HGB 8.4 (L) 02/04/2017   HCT 26.4 (L) 02/04/2017   MCV 86.0 02/04/2017   PLT 788 (H) 02/04/2017    Lab Results  Component Value Date   CREATININE 1.05 02/01/2017   BUN 8 02/01/2017   NA 138 02/01/2017   K 3.9 02/01/2017   CL 108 02/01/2017   CO2 22 02/01/2017    Lab Results  Component Value Date   ALT 27 02/01/2017   AST 90 (H) 02/01/2017   ALKPHOS 68 02/01/2017     Microbiology: Recent Results (from the past 240 hour(s))  Blood culture (routine x 2)     Status: None (Preliminary result)   Collection Time: 01/30/17  9:20 AM  Result Value Ref Range Status   Specimen Description BLOOD LEFT ANTECUBITAL  Final   Special Requests   Final    BOTTLES DRAWN AEROBIC AND ANAEROBIC Blood Culture adequate volume   Culture NO GROWTH 4 DAYS  Final   Report Status PENDING  Incomplete  Blood culture (routine x 2)     Status: None (Preliminary result)   Collection Time: 01/30/17  9:25 AM  Result Value Ref Range Status   Specimen Description BLOOD LEFT HAND  Final   Special Requests   Final    BOTTLES DRAWN AEROBIC AND ANAEROBIC Blood Culture adequate volume   Culture NO GROWTH 4 DAYS  Final   Report Status PENDING  Incomplete  Respiratory Panel by PCR     Status: None   Collection Time: 01/30/17  7:51 PM  Result Value Ref Range  Status   Adenovirus NOT DETECTED NOT DETECTED Final   Coronavirus 229E NOT DETECTED NOT DETECTED Final   Coronavirus HKU1 NOT DETECTED NOT DETECTED Final   Coronavirus NL63 NOT DETECTED NOT DETECTED Final   Coronavirus OC43 NOT DETECTED NOT DETECTED Final   Metapneumovirus NOT DETECTED NOT DETECTED Final   Rhinovirus / Enterovirus NOT DETECTED NOT DETECTED Final   Influenza A NOT DETECTED NOT DETECTED Final   Influenza B NOT DETECTED NOT DETECTED Final   Parainfluenza Virus 1 NOT DETECTED NOT DETECTED Final   Parainfluenza Virus 2 NOT DETECTED NOT DETECTED Final   Parainfluenza Virus 3 NOT DETECTED NOT DETECTED Final   Parainfluenza Virus 4 NOT DETECTED NOT DETECTED Final   Respiratory Syncytial Virus NOT DETECTED NOT DETECTED Final   Bordetella pertussis NOT DETECTED NOT DETECTED Final   Chlamydophila pneumoniae NOT DETECTED NOT DETECTED Final   Mycoplasma pneumoniae NOT DETECTED NOT DETECTED Final    Impression/Plan:  1. Fever - no etiology identified.  No signs of infection.  Most concerning for hematologic process and differential as in intial note from me and Dr. Beryle Beams.   I think for an ID standpoint, can follow him as an outpatient and see him later this week to go over results of the bone  marrow biopsy which was sent for pathology and cultures.  Pending results other than the bone marrow to check on in follow up include Strongyloides Ab and HTLV1 Ab, protein electrophoresis and immunofixation.    2.  Itch - he is requesting something to help with his itch on the back at discharge.    I will arrange follow up in our clinic later this week.  thanks

## 2017-02-04 NOTE — Procedures (Signed)
Anemia, FUO  S/p CT Lt iliac BM ASP AND CORE BX  No comp Stable Full report in pacs Path pending EBL 5cc

## 2017-02-04 NOTE — Discharge Summary (Signed)
Physician Discharge Summary  Orville Mena XVQ:[id number] DOB: Feb 01, 1991 DOA: 01/30/2017  PCP: Patient, No Pcp Per  Admit date: 01/30/2017 Discharge date: 02/04/2017  Admitted From: Home Disposition:  Home  Recommendations for Outpatient Follow-up:  1. Follow up with PCP as scheduled 2. Follow up with Infectious Disease as scheduled 3. Please follow up on bone marrow biopsy results  Discharge Condition:Stable CODE STATUS:Full Diet recommendation: Regular   Brief/Interim Summary: 26 y.o.malewith no significant past medical history. Patient is an immigrant to the [place] from the [place] 2 years ago. Patient states he is not up-to-date on any of his immunizations. Patient was in a home with 3 other individuals from [place]. All others in the home are healthy but is unsure of their immunization status. No individuals in the home have traveled outside the country in the last several months. Patient was in his normal state of health until approximately 3 weeks prior to admission when he developed intermittent knee pain. Seemed to have a waxing and waning nature but was worse with ambulation. This would occur approximately 3-4 days out of the week. Patient unable to identify any trigger or alleviating factor associated with the pain other than rest. (Level V caveat applies as patient encounter was dictated by [place] interpreter with some difficulty. For example through the interpreter pt stated he had foot pain but was clearly pointing to his knee.). Patient states he has also felt warm over this period of time and has also felt itchy.Patient does endorse chest pain which seems to be described as a deep feeling of pain in his chest. This pain is nonexertional and is not associated with shortness of breath, diaphoresis, nausea or radiation to the neck arm or back. This pain is constant and mild. Rash on upper chest started approximately 1 week ago gradually across upper chest, arms  and upper back. Patient denies ever being sexually active. Denies nausea, vomiting, abdominal pain, dysuria, frequency, penile discharge, groin adenopathy, neck stiffness, headache, LOC, focal neurological deficit. Patient does endorse approximately 2 week history of bilateral middle finger swelling and pain. Constant.  FUO:  - Patient with presenting rash on arms and chest/back, B knee pain w/o swelling, B middle finger swelling and pain, and fever for 3 weeks, presenting WBC 14.6. -ID following. Discussed case with Infectious Disease. Recommendation for CT chest, abd/pelvis with contrast to r/o underlying pathology -Labs reviewed. WBC improved. Fevers resolved -Thus far, blood cx neg, hepatitis, HIV neg. Per ID, malaria screen preliminary neg. -Continues to be febrile. Have added ibuprofen PRN fevers -Had discussed with ID, recommendations for chest, abd/pelvis CT. Reviewed. No significant findings noted. -Appreciate input by Hematology. Pt underwent bone marrow biopsy 7/23  Chest pain:  -Patient is continued on tele -Remained stable  Rash:  - Likely due to cause of FUO.  - Patient to continue with topical steroid as needed  AKI: Presenting Cr 1.33. Likely from dehydration from very poor oral intake for several days. - Renal function improved with IVF hydration  Hyponatremia: 128. Likely from poor oral intake for several days w/ limited free water intake.  - normalized - stable at present  Discharge Diagnoses:  Active Problems:   FUO (fever of unknown origin)   Atypical chest pain   Rash   AKI (acute kidney injury) ([place])   Hyponatremia   Normochromic anemia   Elevated serum lactate dehydrogenase (LDH)   Lymphadenopathy, axillary    Discharge Instructions   Allergies as of 02/04/2017   No  Known Allergies     Medication List    TAKE these medications   hydrocortisone cream 1 % Apply topically 2 (two) times daily. To affected areas as needed   ibuprofen 200 MG  tablet Commonly known as:  ADVIL,MOTRIN Take 200 mg by mouth every 6 (six) hours as needed for moderate pain.      Follow-up [facility] Follow up.   Why:  Appointment: Thrusday, February 07, 2017 at 9am. Please call if you are unable to keep this appointment.  Contact information: [place] [id number] [phone]       Thayer Headings, MD. Schedule an appointment as soon as possible for a visit.   Specialty:  Infectious Diseases Why:  as scheduled Contact information: [address] [address] [phone]          No Known Allergies  Consultations:  ID  Hematology  Procedures/Studies: Dg Chest 2 View  Result Date: 01/30/2017 CLINICAL DATA:  Body aches, rash and fevers, 3 weeks duration. EXAM: CHEST  2 VIEW COMPARISON:  None. FINDINGS: Heart size is normal. Mediastinal shadows are normal. Mild left hilar prominence likely due to pulmonary vessels. The lungs are clear. No effusions. No bone abnormality. IMPRESSION: Normal chest Electronically Signed   By: Nelson Chimes M.D.   On: 01/30/2017 10:01   Ct Chest W Contrast  Result Date: 02/01/2017 CLINICAL DATA:  26 year old male with fever of unknown origin. EXAM: CT CHEST, ABDOMEN, AND PELVIS WITH CONTRAST TECHNIQUE: Multidetector CT imaging of the chest, abdomen and pelvis was performed following the standard protocol during bolus administration of intravenous contrast. CONTRAST:  134m ISOVUE-300 IOPAMIDOL (ISOVUE-300) INJECTION 61% COMPARISON:  Chest radiograph dated 01/30/2017 FINDINGS: CT CHEST FINDINGS Cardiovascular: There is no cardiomegaly or pericardial effusion. The thoracic aorta is unremarkable. The origins of the great vessels of the aortic arch are patent. The central pulmonary arteries appear unremarkable. Mediastinum/Nodes: There is no hilar or mediastinal adenopathy. The esophagus and the thyroid gland are  grossly unremarkable. No mediastinal fluid collection or hematoma. Lungs/Pleura: There is a 5 mm left upper lobe nodule (series 7, image 65). There is no focal consolidation, pleural effusion, or pneumothorax. The central airways are patent. Musculoskeletal: Multiple top-normal axillary lymph nodes which demonstrate fatty hilum, likely reactive. Clinical correlation is recommended. There is thickening of the skin in the axilla. No fluid collection. Multiple small nodular density in the subcutaneous soft tissues of the left anterior chest wall (series 3, image 26) of indeterminate etiology, likely normal appearing lymph nodes. Nonenlarged left supraclavicular lymph node measuring 7 mm in short axis. The chest wall soft tissues are otherwise unremarkable. The osseous structures are intact. CT ABDOMEN PELVIS FINDINGS Evaluation of this exam is limited due to respiratory motion artifact. There is no intra-abdominal free air or free fluid. Hepatobiliary: Probable mild fatty infiltration of the liver. No intrahepatic biliary ductal dilatation. The gallbladder is unremarkable. Pancreas: Unremarkable. No pancreatic ductal dilatation or surrounding inflammatory changes. Spleen: Normal in size without focal abnormality. Adrenals/Urinary Tract: Adrenal glands are unremarkable. Kidneys are normal, without renal calculi, focal lesion, or hydronephrosis. Bladder is unremarkable. Stomach/Bowel: There is loose stool within the colon compatible with diarrheal state. Correlation with clinical exam and stool cultures recommended. Multiple normal caliber fluid-filled loops of small bowel throughout the abdomen noted which may be physiologic although enteritis is not excluded. Clinical correlation is recommended. There is no bowel obstruction. Normal appendix. Vascular/Lymphatic:  No significant vascular findings are present. No enlarged abdominal or pelvic lymph nodes. Reproductive: The prostate and seminal vesicles are grossly  unremarkable. Other: There is diastases of anterior abdominal wall musculature with a broad-based fat containing umbilical hernia. No fluid collection. Musculoskeletal: No acute or significant osseous findings. IMPRESSION: 1. A 5 mm left upper lobe nodule. No follow-up needed if patient is low-risk. Non-contrast chest CT can be considered in 12 months if patient is high-risk. This recommendation follows the consensus statement: Guidelines for Management of Incidental Pulmonary Nodules Detected on CT Images: From the Fleischner Society 2017; Radiology 2017; 284:228-243. 2. Top-normal bilateral axillary lymph nodes which demonstrate normal fatty hilum, likely reactive. Clinical correlation is recommended. 3. Fatty liver. 4. Fluid field loops of normal caliber small bowel may be physiologic or represent enteritis. Clinical correlation is recommended. 5. Diarrheal state. Correlation with clinical exam and stool cultures recommended. No bowel obstruction. Normal appendix. Electronically Signed   By: Anner Crete M.D.   On: 02/01/2017 06:35   Ct Abdomen Pelvis W Contrast  Result Date: 02/01/2017 CLINICAL DATA:  26 year old male with fever of unknown origin. EXAM: CT CHEST, ABDOMEN, AND PELVIS WITH CONTRAST TECHNIQUE: Multidetector CT imaging of the chest, abdomen and pelvis was performed following the standard protocol during bolus administration of intravenous contrast. CONTRAST:  167m ISOVUE-300 IOPAMIDOL (ISOVUE-300) INJECTION 61% COMPARISON:  Chest radiograph dated 01/30/2017 FINDINGS: CT CHEST FINDINGS Cardiovascular: There is no cardiomegaly or pericardial effusion. The thoracic aorta is unremarkable. The origins of the great vessels of the aortic arch are patent. The central pulmonary arteries appear unremarkable. Mediastinum/Nodes: There is no hilar or mediastinal adenopathy. The esophagus and the thyroid gland are grossly unremarkable. No mediastinal fluid collection or hematoma. Lungs/Pleura: There is a  5 mm left upper lobe nodule (series 7, image 65). There is no focal consolidation, pleural effusion, or pneumothorax. The central airways are patent. Musculoskeletal: Multiple top-normal axillary lymph nodes which demonstrate fatty hilum, likely reactive. Clinical correlation is recommended. There is thickening of the skin in the axilla. No fluid collection. Multiple small nodular density in the subcutaneous soft tissues of the left anterior chest wall (series 3, image 26) of indeterminate etiology, likely normal appearing lymph nodes. Nonenlarged left supraclavicular lymph node measuring 7 mm in short axis. The chest wall soft tissues are otherwise unremarkable. The osseous structures are intact. CT ABDOMEN PELVIS FINDINGS Evaluation of this exam is limited due to respiratory motion artifact. There is no intra-abdominal free air or free fluid. Hepatobiliary: Probable mild fatty infiltration of the liver. No intrahepatic biliary ductal dilatation. The gallbladder is unremarkable. Pancreas: Unremarkable. No pancreatic ductal dilatation or surrounding inflammatory changes. Spleen: Normal in size without focal abnormality. Adrenals/Urinary Tract: Adrenal glands are unremarkable. Kidneys are normal, without renal calculi, focal lesion, or hydronephrosis. Bladder is unremarkable. Stomach/Bowel: There is loose stool within the colon compatible with diarrheal state. Correlation with clinical exam and stool cultures recommended. Multiple normal caliber fluid-filled loops of small bowel throughout the abdomen noted which may be physiologic although enteritis is not excluded. Clinical correlation is recommended. There is no bowel obstruction. Normal appendix. Vascular/Lymphatic: No significant vascular findings are present. No enlarged abdominal or pelvic lymph nodes. Reproductive: The prostate and seminal vesicles are grossly unremarkable. Other: There is diastases of anterior abdominal wall musculature with a broad-based  fat containing umbilical hernia. No fluid collection. Musculoskeletal: No acute or significant osseous findings. IMPRESSION: 1. A 5 mm left upper lobe nodule. No follow-up needed if patient is low-risk. Non-contrast chest  CT can be considered in 12 months if patient is high-risk. This recommendation follows the consensus statement: Guidelines for Management of Incidental Pulmonary Nodules Detected on CT Images: From the Fleischner Society 2017; Radiology 2017; 284:228-243. 2. Top-normal bilateral axillary lymph nodes which demonstrate normal fatty hilum, likely reactive. Clinical correlation is recommended. 3. Fatty liver. 4. Fluid field loops of normal caliber small bowel may be physiologic or represent enteritis. Clinical correlation is recommended. 5. Diarrheal state. Correlation with clinical exam and stool cultures recommended. No bowel obstruction. Normal appendix. Electronically Signed   By: Anner Crete M.D.   On: 02/01/2017 06:35   Ct Bone Marrow Biopsy & Aspiration  Result Date: 02/04/2017 INDICATION: ANEMIA, FEVER OF UNKNOWN ORIGIN, ELEVATED LDH EXAM: CT GUIDED LEFT ILIAC BONE MARROW ASPIRATION AND CORE BIOPSY Date:  7/23/20187/23/2018 9:33 am Radiologist:  M. Daryll Brod, MD Guidance:  CT FLUOROSCOPY TIME:  Fluoroscopy Time: NONE. MEDICATIONS: None. ANESTHESIA/SEDATION: 2.5 mg IV Versed; 125 mcg IV Fentanyl Moderate Sedation Time:  15 MINUTES The patient was continuously monitored during the procedure by the interventional radiology nurse under my direct supervision. CONTRAST:  None. COMPLICATIONS: None PROCEDURE: Informed consent was obtained from the patient following explanation of the procedure, risks, benefits and alternatives. The patient understands, agrees and consents for the procedure. All questions were addressed. A time out was performed. The patient was positioned prone and non-contrast localization CT was performed of the pelvis to demonstrate the iliac marrow spaces. Maximal  barrier sterile technique utilized including caps, mask, sterile gowns, sterile gloves, large sterile drape, hand hygiene, and Betadine prep. Under sterile conditions and local anesthesia, an 11 gauge coaxial bone biopsy needle was advanced into the left iliac marrow space. Needle position was confirmed with CT imaging. Initially, bone marrow aspiration was performed. Next, the 11 gauge outer cannula was utilized to obtain a left iliac bone marrow core biopsy. Needle was removed. Hemostasis was obtained with compression. The patient tolerated the procedure well. Samples were prepared with the cytotechnologist. No immediate complications. IMPRESSION: CT guided left iliac bone marrow aspiration and core biopsy. Electronically Signed   By: Jerilynn Mages.  Shick M.D.   On: 02/04/2017 10:40    Subjective: No complaints today  Discharge Exam: Vitals:   02/04/17 1035 02/04/17 1045  BP: 139/83 139/83  Pulse: 98 92  Resp: 16 16  Temp:     Vitals:   02/04/17 1005 02/04/17 1020 02/04/17 1035 02/04/17 1045  BP: (!) 146/79 (!) 146/79 139/83 139/83  Pulse: (!) 114 92 98 92  Resp: 16 16 16 16   Temp:      TempSrc:      SpO2: 97% 98% 97% 96%  Weight:      Height:        General: Pt is alert, awake, not in acute distress Cardiovascular: RRR, S1/S2 +, no rubs, no gallops Respiratory: CTA bilaterally, no wheezing, no rhonchi Abdominal: Soft, NT, ND, bowel sounds + Extremities: no edema, no cyanosis   The results of significant diagnostics from this hospitalization (including imaging, microbiology, ancillary and laboratory) are listed below for reference.     Microbiology: Recent Results (from the past 240 hour(s))  Blood culture (routine x 2)     Status: None (Preliminary result)   Collection Time: 01/30/17  9:20 AM  Result Value Ref Range Status   Specimen Description BLOOD LEFT ANTECUBITAL  Final   Special Requests   Final    BOTTLES DRAWN AEROBIC AND ANAEROBIC Blood Culture adequate volume   Culture  NO  GROWTH 4 DAYS  Final   Report Status PENDING  Incomplete  Blood culture (routine x 2)     Status: None (Preliminary result)   Collection Time: 01/30/17  9:25 AM  Result Value Ref Range Status   Specimen Description BLOOD LEFT HAND  Final   Special Requests   Final    BOTTLES DRAWN AEROBIC AND ANAEROBIC Blood Culture adequate volume   Culture NO GROWTH 4 DAYS  Final   Report Status PENDING  Incomplete  Respiratory Panel by PCR     Status: None   Collection Time: 01/30/17  7:51 PM  Result Value Ref Range Status   Adenovirus NOT DETECTED NOT DETECTED Final   Coronavirus 229E NOT DETECTED NOT DETECTED Final   Coronavirus HKU1 NOT DETECTED NOT DETECTED Final   Coronavirus NL63 NOT DETECTED NOT DETECTED Final   Coronavirus OC43 NOT DETECTED NOT DETECTED Final   Metapneumovirus NOT DETECTED NOT DETECTED Final   Rhinovirus / Enterovirus NOT DETECTED NOT DETECTED Final   Influenza A NOT DETECTED NOT DETECTED Final   Influenza B NOT DETECTED NOT DETECTED Final   Parainfluenza Virus 1 NOT DETECTED NOT DETECTED Final   Parainfluenza Virus 2 NOT DETECTED NOT DETECTED Final   Parainfluenza Virus 3 NOT DETECTED NOT DETECTED Final   Parainfluenza Virus 4 NOT DETECTED NOT DETECTED Final   Respiratory Syncytial Virus NOT DETECTED NOT DETECTED Final   Bordetella pertussis NOT DETECTED NOT DETECTED Final   Chlamydophila pneumoniae NOT DETECTED NOT DETECTED Final   Mycoplasma pneumoniae NOT DETECTED NOT DETECTED Final     Labs: BNP (last 3 results) No results for input(s): BNP in the last 8760 hours. Basic Metabolic Panel:  Recent Labs Lab 01/30/17 0925 01/31/17 0534 02/01/17 0435  NA 128* 134* 138  K 3.3* 3.9 3.9  CL 95* 101 108  CO2 19* 24 22  GLUCOSE 99 93 92  BUN 20 16 8   CREATININE 1.33* 1.18 1.05  CALCIUM 9.0 8.5* 8.6*   Liver Function Tests:  Recent Labs Lab 01/30/17 0925 01/31/17 0534 02/01/17 0435  AST 95* 86* 90*  ALT 32 28 27  ALKPHOS 89 72 68  BILITOT 1.4* 0.8  0.5  PROT 8.0 6.9 6.6  ALBUMIN 2.9* 2.3* 2.4*   No results for input(s): LIPASE, AMYLASE in the last 168 hours. No results for input(s): AMMONIA in the last 168 hours. CBC:  Recent Labs Lab 01/30/17 0925 01/31/17 0534 02/01/17 0435 02/02/17 0429 02/04/17 0511  WBC 14.6* 11.9* 11.3* 12.6* 12.6*  NEUTROABS 12.3*  --  6.4 10.2* 7.9*  HGB 11.2* 9.5* 9.2* 9.2* 8.4*  HCT 34.7* 29.6* 28.5* 28.2* 26.4*  MCV 85.5 87.6 88.8 88.1 86.0  PLT 638* 611* 629* 663* 788*   Cardiac Enzymes:  Recent Labs Lab 01/30/17 0925 01/30/17 1135 01/30/17 1907 01/30/17 2313  CKTOTAL 254  --   --   --   TROPONINI  --  <0.03 <0.03 <0.03   BNP: Invalid input(s): POCBNP CBG: No results for input(s): GLUCAP in the last 168 hours. D-Dimer No results for input(s): DDIMER in the last 72 hours. Hgb A1c No results for input(s): HGBA1C in the last 72 hours. Lipid Profile No results for input(s): CHOL, HDL, LDLCALC, TRIG, CHOLHDL, LDLDIRECT in the last 72 hours. Thyroid function studies No results for input(s): TSH, T4TOTAL, T3FREE, THYROIDAB in the last 72 hours.  Invalid input(s): FREET3 Anemia work up  Recent Labs  02/01/17 1447 02/02/17 0429  FERRITIN >7,500* >7,500*  RETICCTPCT  --  1.1  Urinalysis    Component Value Date/Time   COLORURINE YELLOW 01/30/2017 1248   APPEARANCEUR HAZY (A) 01/30/2017 1248   LABSPEC 1.026 01/30/2017 1248   PHURINE 5.0 01/30/2017 1248   GLUCOSEU NEGATIVE 01/30/2017 1248   HGBUR MODERATE (A) 01/30/2017 1248   BILIRUBINUR NEGATIVE 01/30/2017 1248   KETONESUR 20 (A) 01/30/2017 1248   PROTEINUR 30 (A) 01/30/2017 1248   NITRITE NEGATIVE 01/30/2017 1248   LEUKOCYTESUR NEGATIVE 01/30/2017 1248   Sepsis Labs Invalid input(s): PROCALCITONIN,  WBC,  LACTICIDVEN Microbiology Recent Results (from the past 240 hour(s))  Blood culture (routine x 2)     Status: None (Preliminary result)   Collection Time: 01/30/17  9:20 AM  Result Value Ref Range Status    Specimen Description BLOOD LEFT ANTECUBITAL  Final   Special Requests   Final    BOTTLES DRAWN AEROBIC AND ANAEROBIC Blood Culture adequate volume   Culture NO GROWTH 4 DAYS  Final   Report Status PENDING  Incomplete  Blood culture (routine x 2)     Status: None (Preliminary result)   Collection Time: 01/30/17  9:25 AM  Result Value Ref Range Status   Specimen Description BLOOD LEFT HAND  Final   Special Requests   Final    BOTTLES DRAWN AEROBIC AND ANAEROBIC Blood Culture adequate volume   Culture NO GROWTH 4 DAYS  Final   Report Status PENDING  Incomplete  Respiratory Panel by PCR     Status: None   Collection Time: 01/30/17  7:51 PM  Result Value Ref Range Status   Adenovirus NOT DETECTED NOT DETECTED Final   Coronavirus 229E NOT DETECTED NOT DETECTED Final   Coronavirus HKU1 NOT DETECTED NOT DETECTED Final   Coronavirus NL63 NOT DETECTED NOT DETECTED Final   Coronavirus OC43 NOT DETECTED NOT DETECTED Final   Metapneumovirus NOT DETECTED NOT DETECTED Final   Rhinovirus / Enterovirus NOT DETECTED NOT DETECTED Final   Influenza A NOT DETECTED NOT DETECTED Final   Influenza B NOT DETECTED NOT DETECTED Final   Parainfluenza Virus 1 NOT DETECTED NOT DETECTED Final   Parainfluenza Virus 2 NOT DETECTED NOT DETECTED Final   Parainfluenza Virus 3 NOT DETECTED NOT DETECTED Final   Parainfluenza Virus 4 NOT DETECTED NOT DETECTED Final   Respiratory Syncytial Virus NOT DETECTED NOT DETECTED Final   Bordetella pertussis NOT DETECTED NOT DETECTED Final   Chlamydophila pneumoniae NOT DETECTED NOT DETECTED Final   Mycoplasma pneumoniae NOT DETECTED NOT DETECTED Final     SIGNED:   Marquez Ceesay, Orpah Melter, MD  [facility] 02/04/2017, 2:12 PM  If 7PM-7AM, please contact night-coverage [email or web address] Password [email or web address]

## 2017-02-04 NOTE — Care Management Note (Signed)
Case Management Note  Patient Details  Name: Carlos Holder MRN: [id number] Date of Birth: 05-20-1991  Subjective/Objective:          CM following for progression and d/c planning.           Action/Plan: 02/05/2017 No d/c needs identified at this time as pt is ambulatory and on no antibiotics at this time. Bx pending, and [facility] for Thursday February 07, 2017.   Expected Discharge Date:      02/07/2017            Expected Discharge Plan:  Home/Self Care  In-House Referral:  NA  Discharge planning Services  [facility] Consult, [facility]  Post Acute Care Choice:  NA Choice offered to:  NA  DME Arranged:  N/A DME Agency:  NA  HH Arranged:  NA HH Agency:  NA  Status of Service:  Completed, signed off  If discussed at Long Length of Stay Meetings, dates discussed:    Additional Comments:  Starlyn SkeansRoyal, Lititia Sen U, RN 02/04/2017, 12:12 PM

## 2017-02-05 ENCOUNTER — Other Ambulatory Visit (HOSPITAL_COMMUNITY): Payer: Self-pay

## 2017-02-05 LAB — IMMUNOFIXATION ELECTROPHORESIS
IGA: 168 mg/dL (ref 90–386)
IGM, SERUM: 28 mg/dL (ref 20–172)
IgG (Immunoglobin G), Serum: 1461 mg/dL (ref 700–1600)
Total Protein ELP: 6 g/dL (ref 6.0–8.5)

## 2017-02-05 LAB — ACID FAST SMEAR (AFB, MYCOBACTERIA): Acid Fast Smear: NEGATIVE

## 2017-02-05 LAB — PROTEIN ELECTROPHORESIS, SERUM
A/G RATIO SPE: 0.6
ALBUMIN ELP: 2.2 g/dL
ALPHA-1-GLOBULIN: 0.4 g/dL
Alpha-2-Globulin: 0.8 g/dL
BETA GLOBULIN: 1.1 g/dL
GAMMA GLOBULIN: 1.4 g/dL
Globulin, Total: 3.8 g/dL
Total Protein ELP: 6 g/dL

## 2017-02-05 LAB — MISC LABCORP TEST (SEND OUT): LABCORP TEST CODE: 164000

## 2017-02-05 LAB — ACID FAST SMEAR (AFB)

## 2017-02-06 LAB — HTLV I+II ANTIBODIES, (EIA), BLD: HTLV I/II Ab: NEGATIVE

## 2017-02-06 NOTE — Progress Notes (Deleted)
Patient ID: Carlos Holder, male   DOB: [birth year], 26 y.o.   MRN: [id number] After being hospitalized 01/30/2017-02/04/2017 for FUO and joint pain with leukocytosis.  [place] from [place] ago and is alleged w/o any immunizations.  Hematology and Infectious Dz consulted.  AKI and hyponatremia improved with IVF  Blood cultures, malaria, HIV, hepatits all negative. CT chest/abd/pelvis w/o significant findings.  CT guided bone marrow aspiration was performed.

## 2017-02-07 ENCOUNTER — Inpatient Hospital Stay: Payer: Self-pay

## 2017-02-11 ENCOUNTER — Inpatient Hospital Stay: Payer: Self-pay | Admitting: Internal Medicine

## 2017-02-12 ENCOUNTER — Inpatient Hospital Stay: Payer: Self-pay

## 2017-02-12 NOTE — Progress Notes (Deleted)
Patient ID: Carlos Holder, male   DOB: September 18, 1990, 26 y.o.   MRN: [id number] after being hospitalized 7/18-7/23/2018 for FUO.   From D/C summary: FUO:  - Patient with presenting rash on arms and chest/back, B knee pain w/o swelling, B middle finger swelling and pain, and fever for 3 weeks, presenting WBC 14.6. -ID following. Discussed case with Infectious Disease. Recommendation for CT chest, abd/pelvis with contrast to r/o underlying pathology -Labs reviewed. WBC improved. Fevers resolved -Thus far, blood cx neg, hepatitis, HIV neg. Per ID, malaria screen preliminary neg. -Continues to be febrile. Have added ibuprofen PRN fevers -Had discussed with ID, recommendations for chest, abd/pelvis CT. Reviewed. No significant findings noted. -Appreciate input by Hematology. Pt underwent bone marrow biopsy 7/23  Chest pain:  -Patient is continued on tele -Remained stable  Rash:  - Likely due to cause of FUO.  - Patient to continue with topical steroid as needed  AKI: Presenting Cr 1.33. Likely from dehydration from very poor oral intake for several days. - Renal function improved with IVF hydration  Hyponatremia: 128. Likely from poor oral intake for several days w/ limited free water intake.  - normalized - stable at present  Discharge Diagnoses:  Active Problems:   FUO (fever of unknown origin)   Atypical chest pain   Rash   AKI (acute kidney injury) ([place])   Hyponatremia   Normochromic anemia   Elevated serum lactate dehydrogenase (LDH)   Lymphadenopathy, axillary

## 2017-02-14 LAB — CHROMOSOME ANALYSIS, BONE MARROW

## 2017-02-18 ENCOUNTER — Ambulatory Visit: Payer: Self-pay | Attending: Internal Medicine | Admitting: Physician Assistant

## 2017-02-18 VITALS — BP 134/86 | HR 98 | Temp 98.7°F | Resp 16 | Wt 152.8 lb

## 2017-02-18 DIAGNOSIS — R21 Rash and other nonspecific skin eruption: Secondary | ICD-10-CM

## 2017-02-18 DIAGNOSIS — R509 Fever, unspecified: Secondary | ICD-10-CM

## 2017-02-18 MED ORDER — HYDROCORTISONE 1 % EX CREA: TOPICAL_CREAM | Freq: Two times a day (BID) | CUTANEOUS | 0 refills | Status: AC

## 2017-02-18 NOTE — Patient Instructions (Signed)
Please call Dr. Luciana Axeomer (Infectious Disease Doc) for a follow up appointment

## 2017-02-18 NOTE — Progress Notes (Signed)
Pt states he was told to get checked up. Pt scratching during

## 2017-02-18 NOTE — Progress Notes (Signed)
Carlos Holder  HBZ:[id number]  BOF:[id number]  DOB - [birth year]  Chief Complaint  Patient presents with  . Hospitalization Follow-up       Subjective:   Carlos Holder is a 26 y.o. male here today for establishment of care. He has no previous past medical history. He has lived here for 2 years. [place]. He speaks [place]. We used the [employer] today. On 01/30/2017 presented to the emergency department with intermittent fevers, joint pain and a rash. His temperature was 99.7. His white blood cell count was elevated at 14.6. His hemoglobin was low at 11.2. His platelets were high at 638. His sodium was 128. His creatinine was 1.3. He was admitted by the internal medicine team. He had complete body CT imaging. Blood cultures and other labs within normal limits. Hematology was consult it for the abnormalities in regards to his CBC. A bone marrow biopsy result is pending. Ultimately he was sent home with steroid cream for the rash and no certain etiology of his fever.  This is the first time seeing him as he was late to previous other appointments and asked to reschedule. He also has never seen the infectious disease doctor as told.  From a rash standpoint he states it is a little bit better with the cream. His fevers have resolved. He is back to working. No new complaints today.    ROS: GEN: denies fever or chills, denies change in weight Skin: denies lesions + rashes HEENT: denies headache, earache, epistaxis, sore throat, or neck pain LUNGS: denies SHOB, dyspnea, PND, orthopnea CV: denies CP or palpitations ABD: denies abd pain, N or V EXT: denies muscle spasms or swelling; no pain in lower ext, no weakness NEURO: denies numbness or tingling, denies sz, stroke or TIA  ALLERGIES: No Known Allergies  PAST MEDICAL HISTORY: Past Medical History:  Diagnosis Date  . Chronic lower back pain   . FUO (fever of unknown origin) 01/30/2017     PAST SURGICAL HISTORY: Past Surgical History:  Procedure Laterality Date  . NO PAST SURGERIES      MEDICATIONS AT HOME: Prior to Admission medications   Medication Sig Start Date End Date Taking? Authorizing Provider  hydrocortisone cream 1 % Apply topically 2 (two) times daily. To affected areas as needed 02/18/17  Yes Ena Dawley, Haifa Hatton S, PA-C  ibuprofen (ADVIL,MOTRIN) 200 MG tablet Take 200 mg by mouth every 6 (six) hours as needed for moderate pain.   Yes [provider]    Family History  Problem Relation Age of Onset  . Stroke Neg Hx   . Heart disease Neg Hx   . Diabetes Neg Hx    Social-unmarried, no children, lives here in [place] with others from his [place], nonsmoker  Objective:   Vitals:   02/18/17 1049  BP: 134/86  Pulse: 98  Resp: 16  Temp: 98.7 F (37.1 C)  TempSrc: Oral  SpO2: 98%  Weight: 152 lb 12.8 oz (69.3 kg)    Exam General appearance : Awake, alert, not in any distress. Speech Clear. Not toxic looking HEENT: Atraumatic and Normocephalic, pupils equally reactive to light and accomodation Neck: supple, no JVD. No cervical lymphadenopathy.  Chest:Good air entry bilaterally, no added sounds  CVS: S1 S2 regular, no murmurs.  Abdomen: Bowel sounds present, Non tender and not distended with no guarding, rigidity or rebound. Extremities: B/L Lower Ext shows no edema, both legs are warm to touch Neurology: Awake alert, and  oriented X 3, CN II-XII intact, Non focal Skin:No Rash Wounds:N/A  Data Review No results found for: HGBA1C   Assessment & Plan  1. FUO  -urged to f/u with ID  -keep monitoring fevers, Motrin as needed  2. Rash-likely related to the source of fever  -cont steroid cream as needed   Return in about 4 weeks (around 03/18/2017).  The patient was given clear instructions to go to ER or return to medical center if symptoms don't improve, worsen or new problems develop. The patient verbalized understanding. The patient was  told to call to get lab results if they haven't heard anything in the next week.    This note has been created with Surveyor, quantity. Any transcriptional errors are unintentional.    Zettie Pho, PA-C [facility] [place], [place]   02/18/2017, 11:22 AM

## 2017-02-21 ENCOUNTER — Encounter (HOSPITAL_COMMUNITY): Payer: Self-pay

## 2017-03-14 LAB — FUNGUS CULTURE WITH STAIN

## 2017-03-14 LAB — FUNGAL ORGANISM REFLEX

## 2017-03-14 LAB — FUNGUS CULTURE RESULT

## 2017-03-19 LAB — ACID FAST CULTURE WITH REFLEXED SENSITIVITIES (MYCOBACTERIA): Acid Fast Culture: NEGATIVE

## 2019-06-26 IMAGING — CT CT BIOPSY AND ASPIRATION BONE MARROW
1 of 3 series · 15 of 32 positions shown, 19 images · non-contrast
Comparison: none

INDICATION: ANEMIA, FEVER OF UNKNOWN ORIGIN, ELEVATED LDH

[Series 2: i-spiral 5.0 b40f · axial · 0.76mm/px · z∈[+1308,+1409]mm · 15 of 33 slices shown, 19 images]
[im 2/33  soft-tissue]
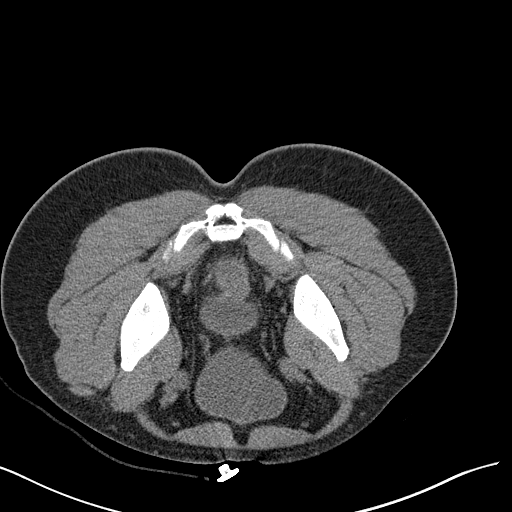
[im 2/33  bone]
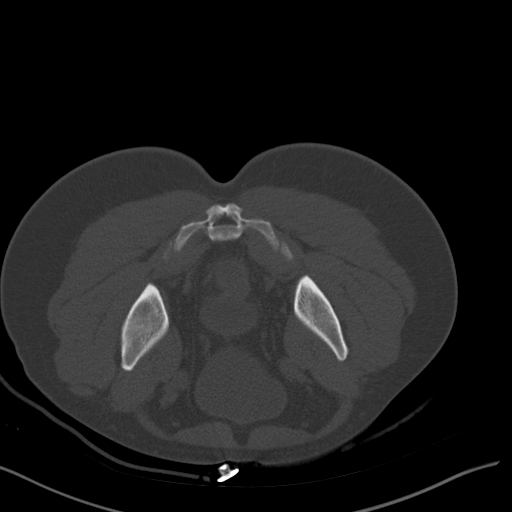
[im 5/33  soft-tissue]
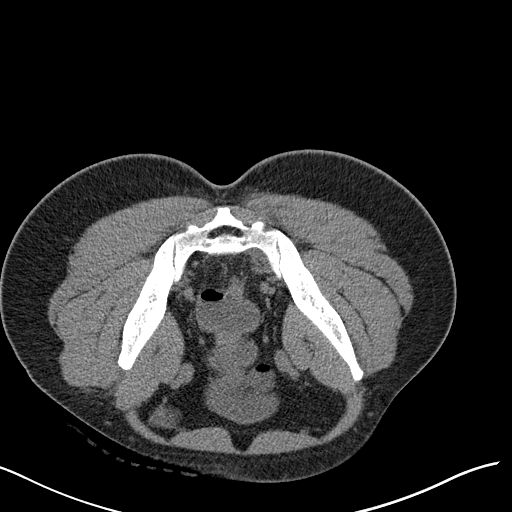
[im 7/33  soft-tissue]
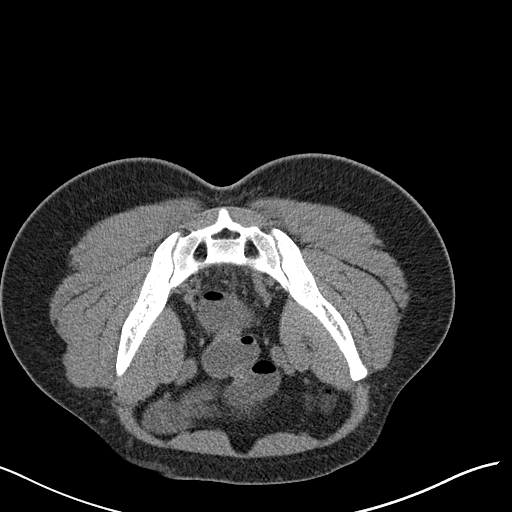
[im 10/33  soft-tissue]
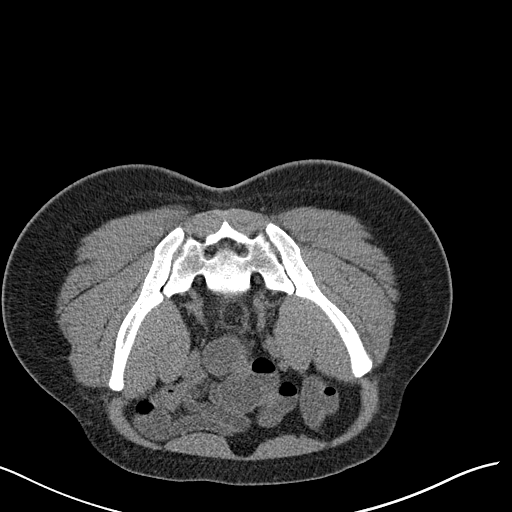
[im 11/33  soft-tissue]
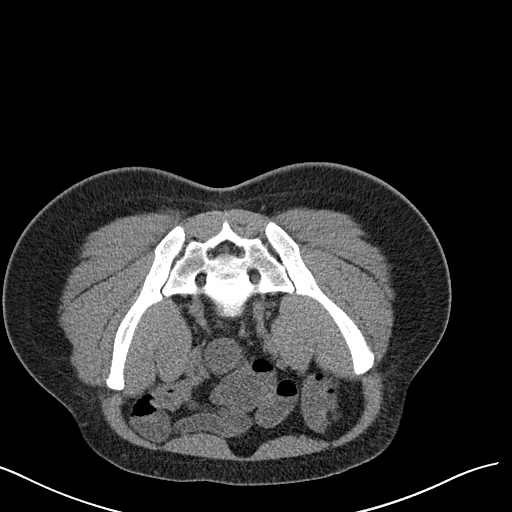
[im 14/33  soft-tissue]
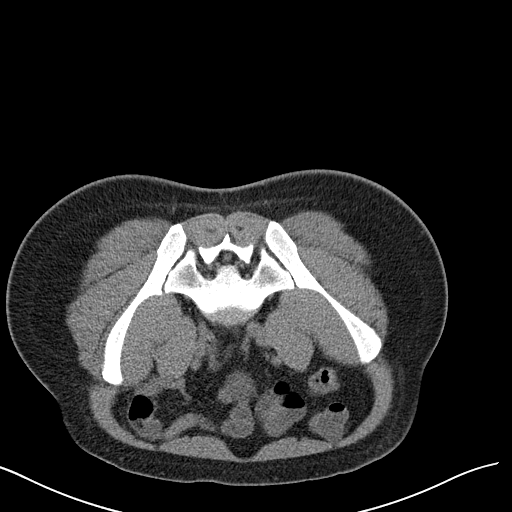
[im 17/33  soft-tissue]
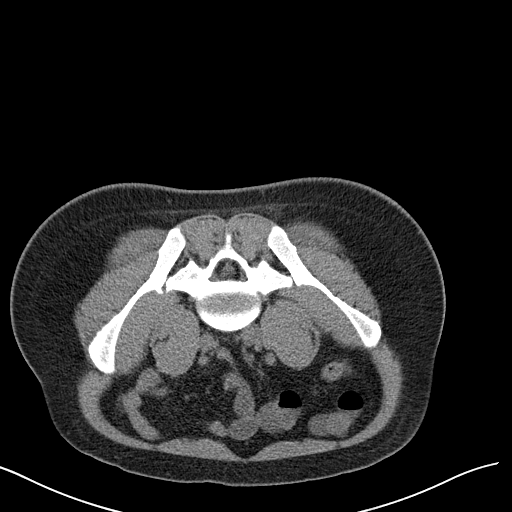
[im 19/33  soft-tissue]
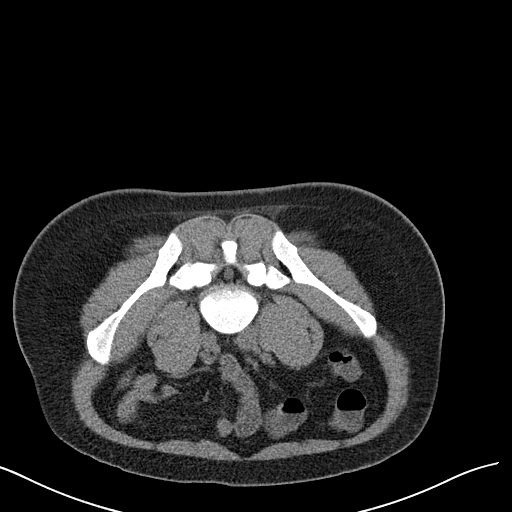
[im 22/33  soft-tissue]
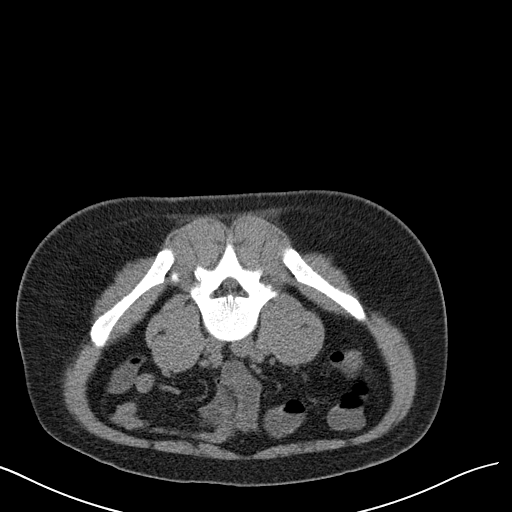
[im 22/33  bone]
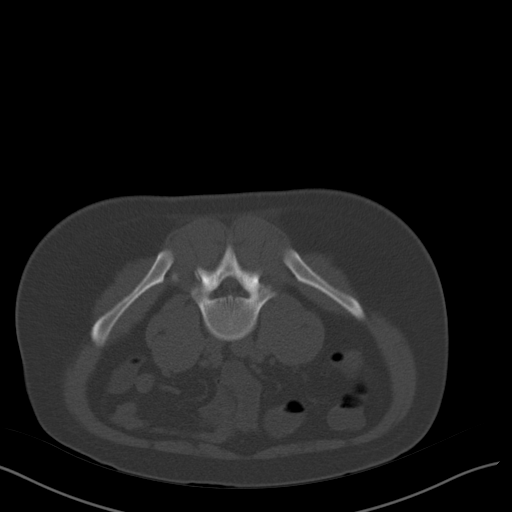
[im 23/33  soft-tissue]
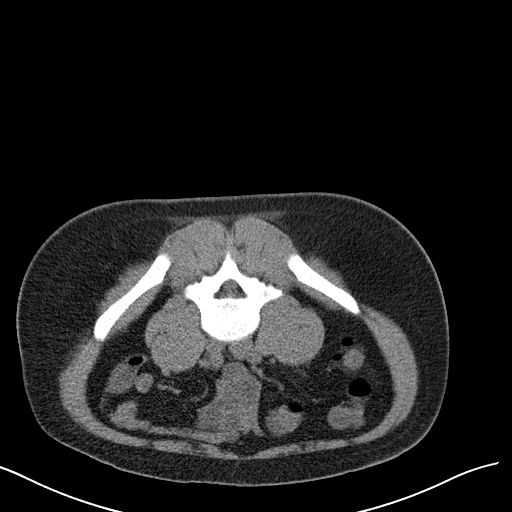
[im 26/33  soft-tissue]
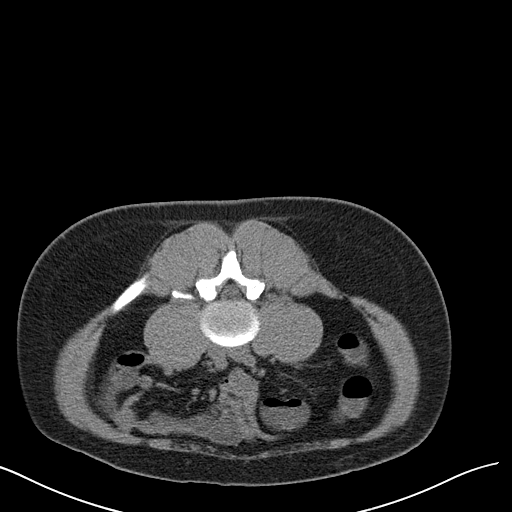
[im 27/33  lung]
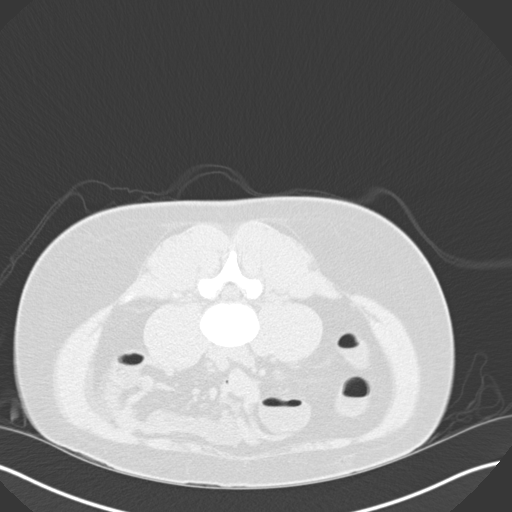
[im 29/33  soft-tissue]
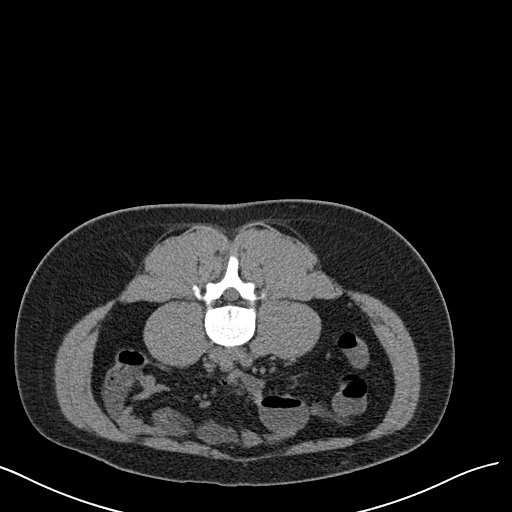
[im 29/33  lung]
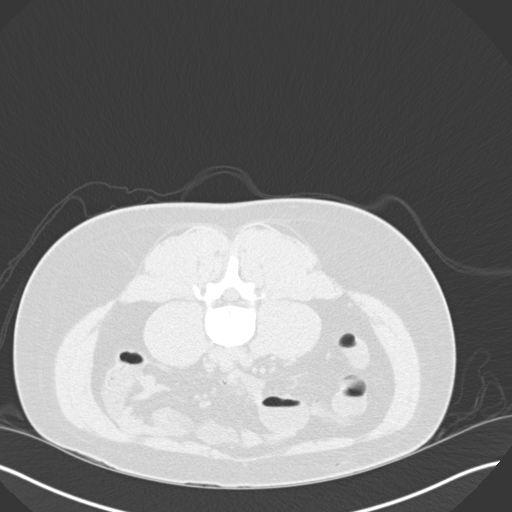
[im 30/33  lung]
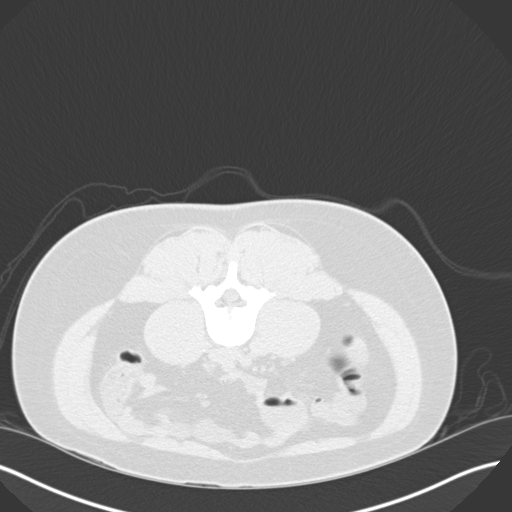
[im 31/33  soft-tissue]
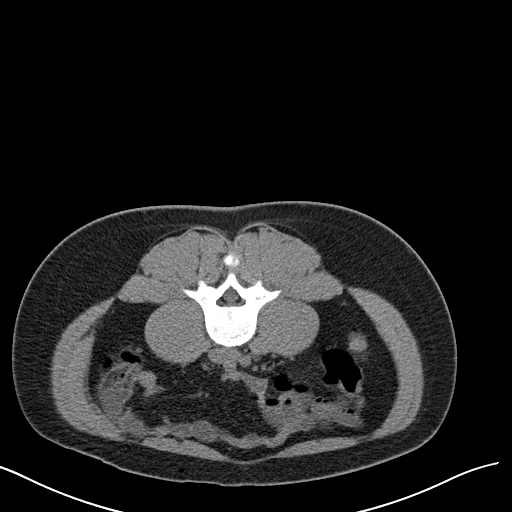
[im 31/33  lung]
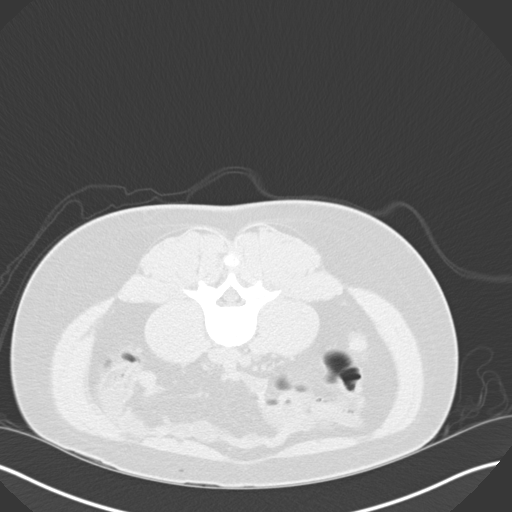

[15 of 32 positions shown; findings below may reference images not displayed]

EXAM:
CT GUIDED LEFT ILIAC BONE MARROW ASPIRATION AND CORE BIOPSY

Radiologist:  Davido, Emmanuel Arthur

Guidance:  CT

FLUOROSCOPY TIME:  Fluoroscopy Time: NONE.

MEDICATIONS:
None.

ANESTHESIA/SEDATION:
2.5 mg IV Versed; 125 mcg IV Fentanyl

Moderate Sedation Time:  15 MINUTES

The patient was continuously monitored during the procedure by the
interventional radiology nurse under my direct supervision.

CONTRAST:  None.

COMPLICATIONS:
None

PROCEDURE:
Informed consent was obtained from the patient following explanation
of the procedure, risks, benefits and alternatives. The patient
understands, agrees and consents for the procedure. All questions
were addressed. A time out was performed.

The patient was positioned prone and non-contrast localization CT
was performed of the pelvis to demonstrate the iliac marrow spaces.

Maximal barrier sterile technique utilized including caps, mask,
sterile gowns, sterile gloves, large sterile drape, hand hygiene,
and Betadine prep.

Under sterile conditions and local anesthesia, an 11 gauge coaxial
bone biopsy needle was advanced into the left iliac marrow space.
Needle position was confirmed with CT imaging. Initially, bone
marrow aspiration was performed. Next, the 11 gauge outer cannula
was utilized to obtain a left iliac bone marrow core biopsy. Needle
was removed. Hemostasis was obtained with compression. The patient
tolerated the procedure well. Samples were prepared with the
cytotechnologist. No immediate complications.
IMPRESSION: CT guided left iliac bone marrow aspiration and core biopsy.
